# Patient Record
Sex: Male | Born: 1955
Health system: Southern US, Community
[De-identification: ages and names within clinical notes are randomized; demographics above are authoritative.]

## PROBLEM LIST (undated history)

## (undated) DIAGNOSIS — G4730 Sleep apnea, unspecified: Secondary | ICD-10-CM

## (undated) DIAGNOSIS — C801 Malignant (primary) neoplasm, unspecified: Secondary | ICD-10-CM

## (undated) DIAGNOSIS — M1990 Unspecified osteoarthritis, unspecified site: Secondary | ICD-10-CM

## (undated) DIAGNOSIS — Z91030 Bee allergy status: Secondary | ICD-10-CM

## (undated) DIAGNOSIS — I10 Essential (primary) hypertension: Secondary | ICD-10-CM

## (undated) DIAGNOSIS — L439 Lichen planus, unspecified: Secondary | ICD-10-CM

## (undated) DIAGNOSIS — E785 Hyperlipidemia, unspecified: Secondary | ICD-10-CM

## (undated) DIAGNOSIS — E669 Obesity, unspecified: Secondary | ICD-10-CM

## (undated) DIAGNOSIS — J45909 Unspecified asthma, uncomplicated: Secondary | ICD-10-CM

## (undated) DIAGNOSIS — R0789 Other chest pain: Secondary | ICD-10-CM

## (undated) DIAGNOSIS — N529 Male erectile dysfunction, unspecified: Secondary | ICD-10-CM

## (undated) DIAGNOSIS — Z87442 Personal history of urinary calculi: Secondary | ICD-10-CM

## (undated) DIAGNOSIS — K219 Gastro-esophageal reflux disease without esophagitis: Secondary | ICD-10-CM

## (undated) DIAGNOSIS — G4733 Obstructive sleep apnea (adult) (pediatric): Secondary | ICD-10-CM

## (undated) DIAGNOSIS — I447 Left bundle-branch block, unspecified: Secondary | ICD-10-CM

## (undated) HISTORY — DX: Hyperlipidemia, unspecified: E78.5

## (undated) HISTORY — DX: Left bundle-branch block, unspecified: I44.7

## (undated) HISTORY — DX: Other chest pain: R07.89

## (undated) HISTORY — DX: Obstructive sleep apnea (adult) (pediatric): G47.33

## (undated) HISTORY — PX: KNEE ARTHROSCOPY: SUR90

## (undated) HISTORY — DX: Essential (primary) hypertension: I10

## (undated) HISTORY — PX: VASECTOMY: SHX75

## (undated) HISTORY — DX: Sleep apnea, unspecified: G47.30

## (undated) HISTORY — DX: Obesity, unspecified: E66.9

## (undated) HISTORY — DX: Male erectile dysfunction, unspecified: N52.9

## (undated) HISTORY — PX: ANAL FISSURE REPAIR: SHX2312

## (undated) HISTORY — PX: SINUS SURGERY WITH INSTATRAK: SHX5215

## (undated) HISTORY — PX: COLONOSCOPY: SHX174

---

## 2000-02-19 ENCOUNTER — Encounter: Payer: Self-pay | Admitting: Emergency Medicine

## 2000-02-19 ENCOUNTER — Emergency Department (HOSPITAL_COMMUNITY): Admission: EM | Admit: 2000-02-19 | Discharge: 2000-02-19 | Payer: Self-pay | Admitting: Emergency Medicine

## 2000-02-24 ENCOUNTER — Emergency Department (HOSPITAL_COMMUNITY): Admission: EM | Admit: 2000-02-24 | Discharge: 2000-02-24 | Payer: Self-pay | Admitting: Emergency Medicine

## 2000-04-08 ENCOUNTER — Ambulatory Visit (HOSPITAL_BASED_OUTPATIENT_CLINIC_OR_DEPARTMENT_OTHER): Admission: RE | Admit: 2000-04-08 | Discharge: 2000-04-08 | Payer: Self-pay | Admitting: Urology

## 2000-04-08 ENCOUNTER — Encounter (INDEPENDENT_AMBULATORY_CARE_PROVIDER_SITE_OTHER): Payer: Self-pay | Admitting: *Deleted

## 2001-07-17 ENCOUNTER — Encounter: Payer: Self-pay | Admitting: Emergency Medicine

## 2001-07-17 ENCOUNTER — Emergency Department (HOSPITAL_COMMUNITY): Admission: EM | Admit: 2001-07-17 | Discharge: 2001-07-18 | Payer: Self-pay | Admitting: Emergency Medicine

## 2001-07-20 ENCOUNTER — Ambulatory Visit (HOSPITAL_COMMUNITY): Admission: RE | Admit: 2001-07-20 | Discharge: 2001-07-20 | Payer: Self-pay | Admitting: General Surgery

## 2002-10-09 ENCOUNTER — Encounter: Payer: Self-pay | Admitting: Family Medicine

## 2002-10-09 ENCOUNTER — Encounter: Admission: RE | Admit: 2002-10-09 | Discharge: 2002-10-09 | Payer: Self-pay | Admitting: Family Medicine

## 2003-07-12 ENCOUNTER — Encounter: Payer: Self-pay | Admitting: Family Medicine

## 2003-07-12 ENCOUNTER — Encounter: Admission: RE | Admit: 2003-07-12 | Discharge: 2003-07-12 | Payer: Self-pay | Admitting: Family Medicine

## 2003-09-16 ENCOUNTER — Encounter: Payer: Self-pay | Admitting: Pulmonary Disease

## 2003-09-17 ENCOUNTER — Encounter: Payer: Self-pay | Admitting: Pulmonary Disease

## 2005-02-18 ENCOUNTER — Observation Stay (HOSPITAL_COMMUNITY): Admission: EM | Admit: 2005-02-18 | Discharge: 2005-02-19 | Payer: Self-pay | Admitting: Emergency Medicine

## 2005-11-05 ENCOUNTER — Inpatient Hospital Stay (HOSPITAL_COMMUNITY): Admission: RE | Admit: 2005-11-05 | Discharge: 2005-11-06 | Payer: Self-pay | Admitting: Otolaryngology

## 2007-03-25 ENCOUNTER — Emergency Department (HOSPITAL_COMMUNITY): Admission: EM | Admit: 2007-03-25 | Discharge: 2007-03-25 | Payer: Self-pay | Admitting: Emergency Medicine

## 2007-05-25 ENCOUNTER — Ambulatory Visit: Payer: Self-pay | Admitting: Gastroenterology

## 2007-06-23 ENCOUNTER — Ambulatory Visit: Payer: Self-pay | Admitting: Gastroenterology

## 2007-06-23 ENCOUNTER — Encounter: Payer: Self-pay | Admitting: Gastroenterology

## 2007-09-19 HISTORY — PX: US ECHOCARDIOGRAPHY: HXRAD669

## 2008-05-31 ENCOUNTER — Emergency Department (HOSPITAL_COMMUNITY): Admission: EM | Admit: 2008-05-31 | Discharge: 2008-05-31 | Payer: Self-pay | Admitting: Emergency Medicine

## 2008-10-15 DIAGNOSIS — I10 Essential (primary) hypertension: Secondary | ICD-10-CM | POA: Insufficient documentation

## 2008-10-15 DIAGNOSIS — E785 Hyperlipidemia, unspecified: Secondary | ICD-10-CM | POA: Insufficient documentation

## 2008-10-16 ENCOUNTER — Ambulatory Visit: Payer: Self-pay | Admitting: Pulmonary Disease

## 2008-10-16 DIAGNOSIS — E119 Type 2 diabetes mellitus without complications: Secondary | ICD-10-CM | POA: Insufficient documentation

## 2008-10-16 DIAGNOSIS — G4733 Obstructive sleep apnea (adult) (pediatric): Secondary | ICD-10-CM | POA: Insufficient documentation

## 2008-10-16 DIAGNOSIS — J309 Allergic rhinitis, unspecified: Secondary | ICD-10-CM | POA: Insufficient documentation

## 2008-12-11 ENCOUNTER — Encounter: Payer: Self-pay | Admitting: Pulmonary Disease

## 2009-01-15 ENCOUNTER — Encounter: Payer: Self-pay | Admitting: Pulmonary Disease

## 2010-01-16 HISTORY — PX: CARDIOVASCULAR STRESS TEST: SHX262

## 2010-07-23 ENCOUNTER — Ambulatory Visit: Payer: Self-pay | Admitting: Cardiology

## 2010-12-29 NOTE — Miscellaneous (Signed)
Summary: Order for CPAP Equipment/[facility]  Order for CPAP Equipment/[facility]   Imported By: Esmeralda Links D'jimraou 01/17/2009 16:37:18  _____________________________________________________________________  External Attachment:    Type:   Image     Comment:   External Document

## 2010-12-29 NOTE — Assessment & Plan Note (Signed)
Summary: consult for osa   Referred by:  Mosetta Putt PCP:  Mosetta Putt   Chief Complaint:  Sleep Consult.  History of Present Illness: the patient is a 55 year old male who I've been asked to see for management of obstructive sleep apnea. He was initially diagnosed with severe sleep apnea 5-6 years ago, with an AHI of 45 events per hour. He was subsequently placed on CPAP, and had significant improvement in his symptoms.the patient continues to use CPAP compliantly, and is also using a fullface mask that is only one month old. He typically goes to bed at 10 PM, and awakens at 7 AM to start his day. He feels that he sleeps well, and is fairly rested in the mornings upon arising. About 3-4 months ago he began to have issues with afternoon sleepiness, but feels that it is better. He denies any sleepiness with driving. However, he admits that his weight is up 50 pounds since his last CPAP adjustment. His Epworth score today is only 7.     Current Allergies: ! PENICILLIN  Past Medical History:         DM (ICD-250.00)    ALLERGIC RHINITIS (ICD-477.9)    OBSTRUCTIVE SLEEP APNEA (ICD-327.23)    HYPERTENSION (ICD-401.9)    HYPERLIPIDEMIA (ICD-272.4)       Past Surgical History:    NSR   Family History:    allergies: daughters, mother    heart disease: father   Social History:    Patient never smoked.     pt is married.    pt has children.   Risk Factors:  Tobacco use:  never   Review of Systems      See HPI   Vital Signs:  Patient Profile:   55 Years Old Male Weight:      328 pounds O2 Sat:      95 % O2 treatment:    Room Air Temp:     98.5 degrees F oral Pulse rate:   90 / minute BP sitting:   136 / 72  (left arm) Cuff size:   large  Vitals Entered By: Cyndia Diver LPN (October 16, 2008 9:26 AM)             Comments Medications reviewed with patient Cyndia Diver LPN  October 16, 2008 9:26 AM      Physical Exam  General:     obese male in  nad Eyes:     PERRLA and EOMI.   Nose:     patent Mouth:     moderate elongation of soft palate and uvula Neck:     no jvd, tmg, ln Lungs:     clear to auscultation Heart:     rrr, no mrg Abdomen:     soft and nontender, bs present Extremities:     no significant edema Neurologic:     alert and oriented, moves all 4 extremities.      Impression & Recommendations:  Problem # 1:  OBSTRUCTIVE SLEEP APNEA (ICD-327.23) the patient has a history of severe obstructive sleep apnea which has been doing fairly well with CPAP. Based on his history, I would wonder if he is having breakthrough symptoms, and especially with his weight gain of 50 pounds. At this point, I think it would be worthwhile to re-optimize  his pressure.  He will also need a new machine very soon.  I have encouraged him to work aggressively on weight loss.  Medications Added to Medication List This Visit: 1)  Gemfibrozil 600 Mg Tabs (Gemfibrozil) .... Take 1 tablet by mouth two times a day 2)  Chlorthalidone 25 Mg Tabs (Chlorthalidone) .... Take 1 tablet by mouth once a day 3)  Lisinopril 20 Mg Tabs (Lisinopril) .... Take 1 tablet by mouth once a day 4)  Coq10 100 Mg Caps (Coenzyme q10) .... Take 1 tablet by mouth once a day 5)  Multivitamins Tabs (Multiple vitamin) .... Take 1 tablet by mouth once a day 6)  Cephalexin 500 Mg Caps (Cephalexin) .... Take 1 tablet by mouth two times a day 7)  Metformin Hcl 500 Mg Xr24h-tab (Metformin hcl) .... Take 1 tablet by mouth two times a day 8)  Klor-con 10 10 Meq Cr-tabs (Potassium chloride) .... Take 1 tablet by mouth two times a day 9)  Prilosec 20 Mg Cpdr (Omeprazole) .... Take 1 tab by mouth every other day 10)  Vitamin B-12 1000 Mcg Tabs (Cyanocobalamin) .... Take 1 tablet by mouth once a day   Patient Instructions: 1)  will get auto for 2 weeks with download 2)  work on weight loss 3)  call us the first of year when new insurance kicks in, and we will arrange for new  cpap machine. 4)  f/u with me in one year or sooner if problems.   ]

## 2011-04-16 NOTE — Op Note (Signed)
[place]  Patient:    [name] [name], [name] [name]                     MRN: [id number] Proc. Date: 04/08/00 Adm. Date:  [id number] Disc. Date: [id number] Attending:  Ellwood Handler CC:         Verl Dicker, M.D.                           Operative Report  DATE OF BIRTH:  September 14, 1956.  PREOPERATIVE DIAGNOSIS:  Undesired fertility.  POSTOPERATIVE DIAGNOSIS:  Undesired fertility.  OPERATION PERFORMED:  Outpatient vasectomy.  SURGEON:  Verl Dicker, M.D.  ANESTHESIA:  General.  DRAINS:  None.  COMPLICATIONS:  None.  SPECIMENS:  Segment of the right and left vas.  DESCRIPTION OF PROCEDURE:  The patient was prepped and draped in the supine position after initiation of satisfactory level of general anesthesia. ____________ was brought to the skin.  Transverse incision was made through the skin and dartos over the vas.  Dissected free with needle tip forceps, grasped with a ring forcep, divided from its sheath.  It was then incised. About a 1 cm segment of the vas was then removed.  Distal vas was cauterized, placing the needle tip cautery down the lumen of the vas.  The proximal end of the vas was sewn back on itself.  The sheath of the vas was then closed over the cauterized distal end of the vas.  Identical technique was used on both the right and left sides.  Dartos was closed with a figure-of-eight stitch. Skin was closed with a figure-of-eight stitch.  The wound was covered with dry gauze, ice bag and athletic supporter and the patient was returned to recovery in satisfactory condition. DD:  04/08/00 TD:  04/11/00 Job: [id number]

## 2011-04-16 NOTE — Op Note (Signed)
[name] [name]              ACCOUNT NO.:  [id number]   MEDICAL RECORD NO.:  [id number]          PATIENT TYPE:  AMB   LOCATION:  DFTL                         FACILITY:  [facility]   PHYSICIAN:  Christopher E. Ezzard Standing, M.D.DATE OF BIRTH:  12/22/1955   DATE OF PROCEDURE:  11/05/2005  DATE OF DISCHARGE:                                 OPERATIVE REPORT   PREOPERATIVE DIAGNOSES:  1.  Septal deformity with nasal obstruction.  2.  Turbinate hypertrophy with nasal obstruction.   POSTOPERATIVE DIAGNOSES:  1.  Septal deformity with nasal obstruction.  2.  Turbinate hypertrophy with nasal obstruction.   OPERATION:  Septoplasty with bilateral inferior turbinate reductions.   SURGEON:  Kristine Garbe. Ezzard Standing, M.D.   ANESTHESIA:  General endotracheal anesthesia.   COMPLICATIONS:  None.   BRIEF CLINICAL NOTE:  [name] [name] is a 55 year old gentleman with severe  obstructive sleep apnea.  He wears nasal CPAP at home but has chronic nasal  obstruction, right side worse than left, and does not tolerate the CPAP  well.  He is taken to the operating room at this time for septoplasty and  turbinate reductions.  Because of obstructive sleep apnea, will require  overnight hospitalization.   DESCRIPTION OF PROCEDURE:  After adequate endotracheal anesthesia, nose was  prepped with Afrin and cotton pledgets and injected with Xylocaine with  epinephrine.  Nose was then prepped with Betadine solution and draped with  sterile towels.  On examination, patient had a large deviation of the bony  septum to the right superiorly and had some protrusion of the maxillary bony  septum and to the left airway more inferiorly.  Patient also had large  turbinates.  A hemitransfixion incision was made along the caudal edge of  the septum on the left side.  Mucoperichondrial and mucoperiosteal flaps  were elevated posteriorly.  Where the caudal edges and bony septum met  inferiorly, it bowed into the left airway.   More superiorly, the bony septum  protruded to the right side.  The portion that bowed into the left airway  was removed after elevating mucoperichondrial and mucoperiosteal flaps on  either side.  Then the __________ incision was made along the caudal edge of  the septum just anterior to the vomer as a bow to the right and  mucoperiosteal and mucoperichondrial flaps were elevated posteriorly on  either side of the bony septum that bowed to the right and this was removed.  This completed the septoplasty portion of the procedure.   Next, the inferior turbinate reduction surgery was performed on the right  side. Incision was made and submucosal tunnel was made adjacent to the bone.  The bone in the lateral turbinate mucosa on the right side was amputated  using scissors.  Remaining turbinate was outfractured.  On the left side,  just a small inferior portion of the turbinate that was hypertrophied was  excised with turbinate scissors and the remaining turbinate was  outfractured.  Then, using bipolar submucosal cauterization, bipolar  submucosal cauterization was performed in the turbinates bilaterally.  The  remaining turbinate tissue was outfractured.  This completed the  turbinate  reductions.  The septum was based.  The hemitransfixion incision was closed  with interrupted 4-0 chromic sutures x2.  Nose was packed with telfa soaked  in bacitracin ointment.  This completed the procedure.  In addition, 40 mg  of Depo-Medrol was injected into the inferior turbinates bilaterally.  This  completed the procedure.  Nose was packed with Telfa soaked in bacitracin  ointment.  Patient was awoke from anesthesia and transferred to recovery  postoperatively doing well.   DISPOSITION:  [name] will be discharged home in the morning after removing his  nasal packs.   Also of note, patient's preoperative chest x-ray had an apparent mass in the  right cardiophrenic angle and recommend repeat exam with a full  inspiration  and will plan on doing this either prior to discharge or on follow-up.           ______________________________  Kristine Garbe. Ezzard Standing, M.D.     CEN/MEDQ  D:  11/05/2005  T:  11/05/2005  Job:  [id number]

## 2011-04-16 NOTE — H&P (Signed)
[name] [name], [name] [name] NO.:  [id number]   MEDICAL RECORD NO.:  [id number]          PATIENT TYPE:  EMS   LOCATION:  MAJO                         FACILITY:  [facility]   PHYSICIAN:  Renato Battles, M.D.     DATE OF BIRTH:  Jul 26, 1956   DATE OF ADMISSION:  02/18/2005  DATE OF DISCHARGE:                                HISTORY & PHYSICAL   PRIMARY CARE PHYSICIAN:  Mosetta Putt, M.D.   CARDIOLOGIST:  Colleen Can. Deborah Chalk, M.D.   REASON FOR ADMISSION:  Chest pain.   HISTORY OF PRESENT ILLNESS:  The patient is a very pleasant 55 year old  white male who experienced left anterior chest discomfort while at rest  today with no radiation, started around the evening time while the patient  was attending seminar.  Associated with shortness of breath, no nausea, no  vomiting, no palpitations, no sweating.  The pain lasted about 2-1/2 to 3  hours and resolved after the patient received aspirin from EMS.  He does not  report having any similar episodes before.   REVIEW OF SYSTEMS:  CONSTITUTIONAL:  No fever, chills, or night sweats.  No  weight changes.  GASTROINTESTINAL:  No nausea or vomiting.  No diarrhea or  constipation.  GENITOURINARY:  No dysuria, hematuria, or retention.  CARDIOPULMONARY:  Positive for chest pain and shortness of breath.  No  orthopnea, no PND, no cough.   PAST MEDICAL HISTORY:  1.  Hypertension.  2.  Hypercholesterolemia.  3.  Severe obstructive sleep apnea on CPAP, 14 cmH2O, and nasal mask.  4.  Depression.  5.  Questionable history of asthma, the patient had childhood asthma, and      recently had couple episodes of shortness of breath and his primary care      physician suspects is asthma trait.  6.  Left bundle branch block.   PAST SURGICAL HISTORY:  1.  Vasectomy.  2.  Anal fissure repair.   SOCIAL HISTORY:  The patient is a Education officer, environmental.  He denies smoking, alcohol, and  drugs.  He lives with his wife.   FAMILY HISTORY:  Positive for CAD in his  father as well as renal failure in  his father, end-stage.   ALLERGIES:  PENICILLIN.   HOME MEDICATIONS:  1.  Zetia 10 mg p.o. daily.  2.  Lisinopril unknown dose.  3.  Aspirin 81 mg p.o. daily.  4.  Zoloft 25 mg p.o. daily.  5.  Flonase.  6.  Serevent.  7.  Chlorthalidone unknown dose.   PHYSICAL EXAMINATION:  GENERAL:  The patient is alert and oriented x3 in no  acute distress.  VITAL SIGNS:  Temperature 98.3, heart rate 91, respiratory rate 20, blood  pressure 135/80.  HEENT:  Head is normocephalic and atraumatic.  Pupils equal, round, and  reactive to light and accommodation.  Extraocular movements intact  bilaterally.  NECK:  No lymphadenopathy, no thyromegaly, no JVD.  No carotid bruits.  CHEST:  Clear to auscultation bilaterally.  No wheezing, rales, or rhonchi.  HEART:  Regular rate and rhythm, no murmurs.  ABDOMEN:  Obese, soft, nontender, nondistended.  Decreased bowel sounds.  EXTREMITIES:  No cyanosis, clubbing, or edema.   LABORATORY DATA:  CBC was completely normal.  ISTAT; electrolytes were all  within normal limits.  Glucose was mildly elevated at 109.   EKG showed left bundle branch block with no acute changes.   ASSESSMENT:  1.  Chest pain.  Although the pain is atypical for cardiac pain, the patient      has multiple risk factors including high blood pressure, high      cholesterol, overweight.  I am going to admit the patient to telemetry,      check cardiac enzymes q.8h x3, start him on beta blocker, continue      Lisinopril and aspirin.  Provide oxygen, morphine, nitroglycerin as      needed.  2.  Hypertension.  Continue with Chlorthalidone and Lisinopril.  We will      contact Dr. Duaine Dredge in the morning regarding accurate dosing.  3.  Hypercholesterolemia.  Check fasting lipids, continue with Zetia.  4.  Sleep apnea, continue with CPAP.      SA/MEDQ  D:  02/19/2005  T:  02/19/2005  Job:  [id number]   cc:   Mosetta Putt, M.D.  [address]  [place]  [place] [address]  Fax: [phone]   Colleen Can. Deborah Chalk, M.D.  Fax: [phone]

## 2011-04-16 NOTE — Op Note (Signed)
[facility]  Patient:    [name] [name], [name] [name] Visit Number: [id number] MRN: [id number]          Service Type: DSU Location: DAY Attending Physician:  Carson Myrtle Proc. Date: 07/20/01 Adm. Date:  07/20/2001                             Operative Report  PREOPERATIVE DIAGNOSES:  Anal fissure, change in bowel habits.  POSTOPERATIVE DIAGNOSES:  Anal fissure, change in bowel habits.  PROCEDURE:  Flexible sigmoidoscopy and repair of anal fissure.  SURGEON:  Timothy E. Earlene Plater, M.D.  ANESTHESIA:  General.  INDICATIONS:  [name] [name] was referred to me because of intermittent rectal bleeding, change in bowel habits from normal to constipation over the past six months, and straining with an acute anal fissure.  He has been treated in the office with conservative management and has failed, and he has been carefully advised about the surgical procedure and wishes to proceed.  DESCRIPTION OF PROCEDURE:  The patient was prepped at home and brought to the operating room, evaluated by anesthesia, taken to the operating room, placed supine, and general endotracheal anesthesia administered.  He was placed in lithotomy position.  The anus was gently dilated, and then the flexible sigmoidoscope was introduced, advanced to the descending colon, approximately 50-60 cm, and there was some liquid stool, but the prep was okay for a flexible sigmoidoscopy.  Careful evaluation of the mucosa from that distance downward revealed no abnormalities.  The scope was withdrawn and the area prepped and draped in the usual fashion.  Visual exam revealed a deep, angry posterior anal fissure and mild to moderate hemorrhoidal disease.  We had agreed to proceed with repair of the fissure only.  Marcaine 0.5% with epinephrine was injected around and about the anal orifice and massaged in well.  Then, digitally, under direct vision, an internal sphincterotomy accomplished  percutaneously with the 15 blade.  The anus dilated slightly, the operating anoscope placed, anoscopy completed.  No other significant findings except for the hemorrhoidal disease and the posterior fissure.  The posterior fissure was debrided and then thoroughly cauterized.  This completed the procedure.  The external sphincter was intact.  Gelfoam, gauze, and a dry sterile dressing applied.  He tolerated it well, was awakened, and taken to the recovery room in good condition. Attending Physician:  Carson Myrtle DD:  07/20/01 TD:  07/20/01 Job: [id number]

## 2011-04-16 NOTE — Discharge Summary (Signed)
[name], [name]              ACCOUNT NO.:  [id number]   MEDICAL RECORD NO.:  [id number]          PATIENT TYPE:  INP   LOCATION:  3712                         FACILITY:  [facility]   PHYSICIAN:  Gertha Calkin, M.D.     DATE OF BIRTH:   DATE OF ADMISSION:  02/18/2005  DATE OF DISCHARGE:  02/19/2005                                 DISCHARGE SUMMARY   PRIMARY CARE PHYSICIAN:  Mosetta Putt, M.D.   CARDIOLOGIST:  Roger Shelter, M.D.   DISCHARGE DIAGNOSES:  1.  Chest pain, atypical, noncardiac.  2.  Hypertension.  3.  Obstructive sleep apnea.  4.  Depression.  5.  History of left bundle branch block.  6.  Questionable asthma.   DISCHARGE MEDICATIONS:  To continue home medications which include:  1.  Zetia 10 mg once daily.  2.  Lisinopril 20 mg once daily.  3.  Aspirin 81 mg once daily.  4.  Flonase once daily.  5.  Serevent one puff at bedtime.  6.  HCTZ 25 mg p.o. once daily.  7.  Zoloft 25 mg p.o. once daily.  8.  New medication:  Protonix 40 mg p.o. once daily.   HOSPITAL COURSE:  Please see H&P for details of admission.   Problem 1. Chest pain, atypical. The patient ruled out with enzymes. No  changes in EKG, has chronic left bundle branch block. The patient had no  chest pain since admission and basically at this point, we will discharge  home with PPI therapy for empiric GI-related chest pain. He is to follow up  with his cardiologist but no urgent followup needed. Enzymes were negative,  no EKG changes, and likely this is noncardiac.   All background medical issues were stable and no changes in the medications,  to resume his home doses as previously prescribed.   DISPOSITION:  Stable at discharge. No telemetry events, no chest pain, and  with stable vitals at discharge.   FOLLOWUP:  Dr. Duaine Dredge and Dr. Deborah Chalk. The patient will make these  appointments to be seen within the next 2 to 3 weeks. No urgent issues or  followup labs needed at the followup  appointment.      JD/MEDQ  D:  02/19/2005  T:  02/20/2005  Job:  [id number]   cc:   Colleen Can. Deborah Chalk, M.D.  Fax: [phone]   Mosetta Putt, M.D.  [address] [place]  [place] [address]  Fax: [phone]

## 2011-07-12 ENCOUNTER — Encounter: Payer: Self-pay | Admitting: Cardiology

## 2011-07-21 ENCOUNTER — Encounter: Payer: Self-pay | Admitting: Cardiology

## 2011-07-21 ENCOUNTER — Ambulatory Visit (INDEPENDENT_AMBULATORY_CARE_PROVIDER_SITE_OTHER): Payer: BC Managed Care – PPO | Admitting: Cardiology

## 2011-07-21 DIAGNOSIS — E785 Hyperlipidemia, unspecified: Secondary | ICD-10-CM

## 2011-07-21 DIAGNOSIS — I447 Left bundle-branch block, unspecified: Secondary | ICD-10-CM

## 2011-07-21 DIAGNOSIS — E669 Obesity, unspecified: Secondary | ICD-10-CM

## 2011-07-21 DIAGNOSIS — I10 Essential (primary) hypertension: Secondary | ICD-10-CM

## 2011-07-21 NOTE — Patient Instructions (Signed)
You need to focus on weight loss and eating appropriate diet for your diabetes and blood pressure.  You need to increase your aerobic activity.  You need to take medications regularly.  I will see you again as needed.

## 2011-07-21 NOTE — Assessment & Plan Note (Signed)
His left bundle-branch block is chronic. He has had evaluation from a cardiac standpoint including stress testing and echocardiogram which were unremarkable. The patient feels that he doesn't need regular cardiac followup and I agree with him. He does need close followup with his primary care for management of his cardiovascular risk factors. I stressed the importance of being compliant with his dietary therapy and medications. I have recommended that he participate in regular aerobic exercise and lose weight. I will plan on seeing him back on a p.r.n. basis

## 2011-07-21 NOTE — Progress Notes (Signed)
[name] [name] Date of Birth: [birth year]   History of Present Illness: [name] [name] it is seen to establish cardiac followup after the recent retirement of Dr. Maylon Cos. He is a 55 year old white male who has a history of hypertension, diabetes mellitus, and hyperlipidemia. He has a chronic left bundle branch block. He really has no history of cardiac disease. He had cardiac evaluation in February 2011 with a nuclear stress test which was normal. Ejection fraction was 54%. He had an echocardiogram in 2008 which showed mild LVH but otherwise normal study. He denies any symptoms of chest pain, shortness of breath, or palpitations. He has gained over 20 pounds since his visit of last year. He admits that he doesn't follow his diet regularly. He doesn't get any significant exercise other than working in his yard. He admits that sometimes he runs out of his medications and may not take it until he gets his next prescription filled. He hasn't taken his medicines for the past 3 days.  Current Outpatient Prescriptions on File Prior to Visit  Medication Sig Dispense Refill  . albuterol (PROVENTIL HFA;VENTOLIN HFA) 108 (90 BASE) MCG/ACT inhaler Inhale 2 puffs into the lungs every 6 (six) hours as needed.        . Coenzyme Q10 10 MG capsule Take 10 mg by mouth daily.        Marland Kitchen EPINEPHrine (EPIPEN JR) 0.15 MG/0.3ML injection Inject 0.15 mg into the muscle as needed.        . fish oil-omega-3 fatty acids 1000 MG capsule Take 1 g by mouth daily.        Marland Kitchen gemfibrozil (LOPID) 600 MG tablet Take 600 mg by mouth daily.        . metFORMIN (GLUCOPHAGE) 500 MG tablet Take 500 mg by mouth 2 (two) times daily with a meal.        . Multiple Vitamin (MULTIVITAMIN) tablet Take 1 tablet by mouth daily.        . Olmesartan-Amlodipine-HCTZ (TRIBENZOR) 40-5-25 MG TABS Take by mouth daily.        Marland Kitchen omeprazole (PRILOSEC) 20 MG capsule Take 20 mg by mouth daily.        . potassium chloride (K-DUR,KLOR-CON) 10 MEQ tablet Take 10  mEq by mouth 2 (two) times daily.        . rosuvastatin (CRESTOR) 10 MG tablet Take 10 mg by mouth daily.        . sertraline (ZOLOFT) 25 MG tablet Take 25 mg by mouth daily.        . vardenafil (LEVITRA) 20 MG tablet Take 20 mg by mouth daily as needed.        . vitamin B-12 (CYANOCOBALAMIN) 1000 MCG tablet Take 1,000 mcg by mouth daily.        Marland Kitchen zinc gluconate 50 MG tablet Take 50 mg by mouth daily.          Allergies  Allergen Reactions  . Penicillins     Past Medical History  Diagnosis Date  . LBBB (left bundle branch block)   . Diabetes mellitus   . ED (erectile dysfunction)   . Hyperlipidemia   . Hypertension   . Obesity   . OSA (obstructive sleep apnea)   . Chest pain, atypical     Past Surgical History  Procedure Date  . Cardiovascular stress test 01/16/2010    EF 54%  . US echocardiography 09/19/2007    EF 55-60%  . Vasectomy     History  Smoking status  . Never Smoker   Smokeless tobacco  . Not on file    History  Alcohol Use No    Family History  Problem Relation Age of Onset  . Hypertension Mother   . Kidney failure Father     Review of Systems: As noted in history of present illness.  All other systems were reviewed and are negative.  Physical Exam: BP 140/88  Pulse 72  Ht 6' (1.829 m)  Wt 323 lb (146.512 kg)  BMI 43.81 kg/m2 He is an obese white male in no acute distress. His HEENT exam is unremarkable. He has no JVD, adenopathy, thyromegaly, or bruits. Lungs are clear. Cardiac exam reveals a regular rate and rhythm without gallop, murmur, or click. Abdomen is obese and nontender. There are no masses or bruits. Extremities are without edema. He has good pedal pulses. Neurologic exam is nonfocal. LABORATORY DATA:   Assessment / Plan:

## 2011-07-21 NOTE — Assessment & Plan Note (Signed)
Blood pressure is actually fairly well controlled considering hasn't taken his medicines for several days. He is noncompliant with his diet and exercise regimen.

## 2011-08-26 LAB — URINALYSIS, ROUTINE W REFLEX MICROSCOPIC
Bilirubin Urine: NEGATIVE
Glucose, UA: NEGATIVE
Hgb urine dipstick: NEGATIVE
Ketones, ur: NEGATIVE
Nitrite: NEGATIVE
Protein, ur: NEGATIVE
Specific Gravity, Urine: 1.024
Urobilinogen, UA: 0.2
pH: 5.5

## 2012-03-29 ENCOUNTER — Encounter: Payer: Self-pay | Admitting: Gastroenterology

## 2012-03-31 ENCOUNTER — Ambulatory Visit (INDEPENDENT_AMBULATORY_CARE_PROVIDER_SITE_OTHER): Payer: BC Managed Care – PPO | Admitting: Pulmonary Disease

## 2012-03-31 ENCOUNTER — Encounter: Payer: Self-pay | Admitting: Pulmonary Disease

## 2012-03-31 VITALS — BP 130/80 | HR 80 | Temp 98.3°F | Ht 72.0 in | Wt 324.0 lb

## 2012-03-31 DIAGNOSIS — G4733 Obstructive sleep apnea (adult) (pediatric): Secondary | ICD-10-CM

## 2012-03-31 NOTE — Progress Notes (Signed)
  Subjective:    Patient ID: [name] [name], male    DOB: November 08, 1956, 56 y.o.   MRN: [id number]  HPI The patient is a 56 year old male who I've been asked to see for management of obstructive sleep apnea.  He was diagnosed in 2004 with severe OSA, and has been on CPAP since that time.  I saw him last in 2009, and his pressure was optimized again to 12 cm of water.  He has been wearing CPAP compliantly since that time, but most recently his wife has started to note breakthrough snoring events and also apneas.  The patient also has noted nonrestorative sleep most recently.  He has a new CPAP machine from 2009, and uses a full face mask that is a little over a year old and does have some leaks.  The patient is starting to notice sleep pressure again during the day with periods of inactivity, but denies any issues with sleepiness while driving.  The patient states that his weight is stable from 2009, and his [facility] Sleepiness Scale were today is only 5.  Sleep Questionnaire: What time do you typically go to bed?( Between what hours) 10 pm How long does it take you to fall asleep? 5 to 10 mins How many times during the night do you wake up? 2 What time do you get out of bed to start your day? 0700 Do you drive or operate heavy machinery in your occupation? No How much has your weight changed (up or down) over the past two years? (In pounds) 15 lb (6.804 kg) Have you ever had a sleep study before? Yes If yes, location of study? [facility] If yes, date of study? 2004 Do you currently use CPAP? Yes If so, what pressure? unsure of pressure Do you wear oxygen at any time? No     Review of Systems  Constitutional: Negative for fever and unexpected weight change.  HENT: Positive for ear pain and dental problem. Negative for nosebleeds, congestion, sore throat, rhinorrhea, sneezing, trouble swallowing, postnasal drip and sinus pressure.   Eyes: Negative for redness and itching.  Respiratory: Positive for  shortness of breath. Negative for cough, chest tightness and wheezing.   Cardiovascular: Negative for palpitations and leg swelling.  Gastrointestinal: Negative for nausea and vomiting.  Genitourinary: Negative for dysuria.  Musculoskeletal: Positive for joint swelling.  Skin: Negative for rash.  Neurological: Negative for headaches.  Hematological: Does not bruise/bleed easily.  Psychiatric/Behavioral: Negative for dysphoric mood. The patient is not nervous/anxious.        Objective:   Physical Exam Constitutional:  Obese male, no acute distress  HENT:  Nares patent without discharge  Oropharynx without exudate, palate and uvula are thick and elongated.   Eyes:  Perrla, eomi, no scleral icterus  Neck:  No JVD, no TMG  Cardiovascular:  Normal rate, regular rhythm, no rubs or gallops.  No murmurs        Intact distal pulses  Pulmonary :  Normal breath sounds, no stridor or respiratory distress   No rales, rhonchi, or wheezing  Abdominal:  Soft, nondistended, bowel sounds present.  No tenderness noted.   Musculoskeletal:  Minimal lower extremity edema noted.  Lymph Nodes:  No cervical lymphadenopathy noted  Skin:  No cyanosis noted  Neurologic:  Alert, appropriate, moves all 4 extremities without obvious deficit.         Assessment & Plan:

## 2012-03-31 NOTE — Assessment & Plan Note (Signed)
The patient has a history of severe obstructive sleep apnea, but had been doing well on CPAP.  He is now having some breakthrough events, and is not as rested upon arising.  He also notes that he is starting to have periods of sleep pressure during the day like he has had in the past.  We'll need to have his machine pressure checked with a manometer, will get him a new mask, and finally will optimize his pressure again on the automatic setting.  I have also encouraged him to work aggressively on weight loss.

## 2012-03-31 NOTE — Patient Instructions (Signed)
Will have your dme check machine, and also get you a new mask Once this is done, will re-optimize your pressure with the automatic mode on your machine for 2 weeks, and will call you with results. Work on weight loss followup with me in one year if doing well, but call if not doing well.

## 2012-09-18 ENCOUNTER — Telehealth: Payer: Self-pay | Admitting: Pulmonary Disease

## 2012-09-18 NOTE — Telephone Encounter (Signed)
Referral was on 03/31/12 for CPAP Auto x 2 wks. Have left message on Lecretia's phone for her to have someone at [facility] fax me a copy of the 2 wk cpap auto download report.  Rhonda J Cobb

## 2012-09-19 NOTE — Telephone Encounter (Signed)
Download given to me shows the pt is not even on auto!!.  Have given to pcc to straighten out.

## 2012-09-19 NOTE — Telephone Encounter (Signed)
Spoke with patient and he stated that he was contacted by [facility] on 04/12/12 and took cpap to [facility]. Pt states that Mardelle Matte checked pt's cpap machine, gave him a new mask and a SD card. Pt stated that he was then contacted to bring card back to the branch on 04/26/12. Mardelle Matte with [facility] faxed Auto CPAP from 04/12/12 to 04/26/12 today. Report placed in Dr. Shelle Iron pink look at folder.  Pt is aware that we will contact him with results. Rhonda J Cobb

## 2012-09-19 NOTE — Telephone Encounter (Signed)
Download faxed by Mardelle Matte from [facility]. Have placed download in pink look at folder. Rhonda J Cobb

## 2012-09-20 ENCOUNTER — Other Ambulatory Visit: Payer: Self-pay | Admitting: Pulmonary Disease

## 2012-09-20 ENCOUNTER — Encounter: Payer: Self-pay | Admitting: Pulmonary Disease

## 2012-09-20 DIAGNOSIS — G4733 Obstructive sleep apnea (adult) (pediatric): Secondary | ICD-10-CM

## 2012-09-20 NOTE — Telephone Encounter (Signed)
An order has already been sent for this.

## 2013-03-09 ENCOUNTER — Encounter: Payer: Self-pay | Admitting: Gastroenterology

## 2013-04-04 ENCOUNTER — Ambulatory Visit: Payer: BC Managed Care – PPO | Admitting: Pulmonary Disease

## 2013-04-25 ENCOUNTER — Ambulatory Visit (INDEPENDENT_AMBULATORY_CARE_PROVIDER_SITE_OTHER): Payer: BC Managed Care – PPO | Admitting: Pulmonary Disease

## 2013-04-25 ENCOUNTER — Telehealth: Payer: Self-pay | Admitting: Pulmonary Disease

## 2013-04-25 ENCOUNTER — Encounter: Payer: Self-pay | Admitting: Pulmonary Disease

## 2013-04-25 VITALS — BP 152/90 | HR 75 | Temp 97.1°F | Ht 73.0 in | Wt 326.4 lb

## 2013-04-25 DIAGNOSIS — G4733 Obstructive sleep apnea (adult) (pediatric): Secondary | ICD-10-CM

## 2013-04-25 NOTE — Assessment & Plan Note (Signed)
The patient is wearing CPAP compliantly, but is having some late afternoon sleepiness.  He is overdue for a new mask, has gained a little bit of weight since last visit, and has a very old machine that probably needs to be replaced.  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  He will followup in one year doing well.

## 2013-04-25 NOTE — Patient Instructions (Addendum)
Will see if you are eligible for a new machine Will need a new cpap mask. Work on weight loss followup with me in one year if doing well.

## 2013-04-25 NOTE — Progress Notes (Signed)
  Subjective:    Patient ID: [name] [name], male    DOB: 04/17/1956, 57 y.o.   MRN: [id number]  HPI The patient comes in today for followup of his obstructive sleep apnea.  He is wearing CPAP compliant, but has an old machine and thinks he is due for a replacement.  He is also overdue for a new CPAP mask.  He sleeps well with the device, and feels fairly rested in the mornings, but does note some late afternoon sleepiness.  His pressure was optimized again last year.   Review of Systems  Constitutional: Negative for fever and unexpected weight change.  HENT: Negative for ear pain, nosebleeds, congestion, sore throat, rhinorrhea, sneezing, trouble swallowing, dental problem, postnasal drip and sinus pressure.   Eyes: Negative for redness and itching.  Respiratory: Negative for cough, chest tightness, shortness of breath and wheezing.   Cardiovascular: Negative for palpitations and leg swelling.  Gastrointestinal: Negative for nausea and vomiting.  Genitourinary: Negative for dysuria.  Musculoskeletal: Negative for joint swelling.  Skin: Negative for rash.  Neurological: Negative for headaches.  Hematological: Does not bruise/bleed easily.  Psychiatric/Behavioral: Negative for dysphoric mood. The patient is not nervous/anxious.        Objective:   Physical Exam Morbidly obese male in no acute distress Nose without purulence or discharge noted No skin breakdown or pressure necrosis from the CPAP mask Neck without lymphadenopathy or thyromegaly Lower extremities with mild edema, no cyanosis Awake, but does appear mildly sleepy, moves all 4 extremities.       Assessment & Plan:

## 2013-05-04 ENCOUNTER — Telehealth: Payer: Self-pay | Admitting: Pulmonary Disease

## 2013-05-04 NOTE — Telephone Encounter (Signed)
Will forward to KC as an FYI 

## 2014-02-26 ENCOUNTER — Other Ambulatory Visit: Payer: Self-pay | Admitting: Orthopedic Surgery

## 2014-02-26 DIAGNOSIS — M25561 Pain in right knee: Secondary | ICD-10-CM

## 2014-03-20 ENCOUNTER — Ambulatory Visit
Admission: RE | Admit: 2014-03-20 | Discharge: 2014-03-20 | Disposition: A | Discharge status: Home / Self Care | Payer: BC Managed Care – PPO | Source: Ambulatory Visit | Attending: Orthopedic Surgery | Admitting: Orthopedic Surgery

## 2014-03-20 DIAGNOSIS — M25561 Pain in right knee: Secondary | ICD-10-CM

## 2014-04-05 ENCOUNTER — Other Ambulatory Visit (HOSPITAL_COMMUNITY): Payer: Self-pay | Admitting: Orthopaedic Surgery

## 2014-04-26 ENCOUNTER — Encounter: Payer: Self-pay | Admitting: Pulmonary Disease

## 2014-04-26 ENCOUNTER — Ambulatory Visit (INDEPENDENT_AMBULATORY_CARE_PROVIDER_SITE_OTHER): Payer: BC Managed Care – PPO | Admitting: Pulmonary Disease

## 2014-04-26 VITALS — BP 138/80 | HR 83 | Temp 98.4°F | Ht 72.0 in | Wt 322.0 lb

## 2014-04-26 DIAGNOSIS — G4733 Obstructive sleep apnea (adult) (pediatric): Secondary | ICD-10-CM

## 2014-04-26 NOTE — Patient Instructions (Signed)
Will send an order to [facility] to get you a new mask and supplies.  Talk with [facility] about frequency of mask CUSHION exchanges.  Will see if you are eligible for a new machine. Work on weight loss followup with me again in one year.

## 2014-04-26 NOTE — Progress Notes (Signed)
   Subjective:    Patient ID: [name] [name], male    DOB: October 30, 1956, 58 y.o.   MRN: [id number]  HPI The patient comes in today for followup of his obstructive sleep apnea. He is wearing CPAP compliantly, and is having no issues with his mask or pressure. He is due for a new mask and supplies. He feels that he is sleeping fairly well with the device, but has his usual awakenings because of musculoskeletal issues. He feels that he is reasonably rested during the day.   Review of Systems  Constitutional: Negative for fever and unexpected weight change.  HENT: Negative for congestion, dental problem, ear pain, nosebleeds, postnasal drip, rhinorrhea, sinus pressure, sneezing, sore throat and trouble swallowing.   Eyes: Negative for redness and itching.  Respiratory: Negative for cough, chest tightness, shortness of breath and wheezing.   Cardiovascular: Negative for palpitations and leg swelling.  Gastrointestinal: Negative for nausea and vomiting.  Genitourinary: Negative for dysuria.  Musculoskeletal: Negative for joint swelling.  Skin: Negative for rash.  Neurological: Negative for headaches.  Hematological: Does not bruise/bleed easily.  Psychiatric/Behavioral: Negative for dysphoric mood. The patient is not nervous/anxious.        Objective:   Physical Exam Obese male in no acute distress Nose without purulence or discharge noted No skin breakdown or pressure necrosis from the CPAP mask Neck without lymphadenopathy or thyromegaly Lower extremities with edema noted, no cyanosis Alert and oriented, moves all 4 extremities.       Assessment & Plan:

## 2014-04-26 NOTE — Assessment & Plan Note (Signed)
The patient feels that he is doing fairly well with CPAP, and is in need of a new mask and supplies. I've asked him to continue working on weight loss, and to let me know if he wishes to check on getting a new machine. I will see him back in one year if he is doing well.

## 2014-05-01 ENCOUNTER — Encounter (HOSPITAL_COMMUNITY): Payer: Self-pay | Admitting: Pharmacy Technician

## 2014-05-07 ENCOUNTER — Encounter (HOSPITAL_COMMUNITY): Payer: Self-pay

## 2014-05-07 ENCOUNTER — Ambulatory Visit (HOSPITAL_COMMUNITY)
Admission: RE | Admit: 2014-05-07 | Discharge: 2014-05-07 | Disposition: A | Discharge status: Home / Self Care | Payer: BC Managed Care – PPO | Source: Ambulatory Visit | Attending: Anesthesiology | Admitting: Anesthesiology

## 2014-05-07 ENCOUNTER — Encounter (HOSPITAL_COMMUNITY)
Admission: RE | Admit: 2014-05-07 | Discharge: 2014-05-07 | Disposition: A | Discharge status: Home / Self Care | Payer: BC Managed Care – PPO | Source: Ambulatory Visit | Attending: Orthopaedic Surgery | Admitting: Orthopaedic Surgery

## 2014-05-07 DIAGNOSIS — Z01818 Encounter for other preprocedural examination: Secondary | ICD-10-CM | POA: Insufficient documentation

## 2014-05-07 HISTORY — DX: Bee allergy status: Z91.030

## 2014-05-07 HISTORY — DX: Gastro-esophageal reflux disease without esophagitis: K21.9

## 2014-05-07 HISTORY — DX: Lichen planus, unspecified: L43.9

## 2014-05-07 HISTORY — DX: Unspecified osteoarthritis, unspecified site: M19.90

## 2014-05-07 LAB — BASIC METABOLIC PANEL
BUN: 21 mg/dL (ref 6–23)
CO2: 28 mEq/L (ref 19–32)
Calcium: 9.5 mg/dL (ref 8.4–10.5)
Chloride: 97 mEq/L (ref 96–112)
Creatinine, Ser: 0.73 mg/dL (ref 0.50–1.35)
GFR calc Af Amer: >90 mL/min (ref >90)
GFR calc non Af Amer: >90 mL/min (ref >90)
Glucose, Bld: 214 mg/dL — ABNORMAL HIGH (ref 70–99)
Potassium: 4.3 mEq/L (ref 3.7–5.3)
Sodium: 137 mEq/L (ref 137–147)

## 2014-05-07 LAB — CBC
HCT: 40.9 % (ref 39.0–52.0)
Hemoglobin: 14.3 g/dL (ref 13.0–17.0)
MCH: 30.6 pg (ref 26.0–34.0)
MCHC: 35 g/dL (ref 30.0–36.0)
MCV: 87.4 fL (ref 78.0–100.0)
Platelets: 234 10*3/uL (ref 150–400)
RBC: 4.68 MIL/uL (ref 4.22–5.81)
RDW: 13 % (ref 11.5–15.5)
WBC: 6.3 10*3/uL (ref 4.0–10.5)

## 2014-05-07 NOTE — Patient Instructions (Signed)
YOUR SURGERY IS SCHEDULED AT [facility]  ON: Friday  6/12  REPORT TO  SHORT STAY CENTER AT:  6:30 AM   PLEASE COME IN THE [facility] ENTRANCE AND FOLLOW SIGNS TO SHORT STAY CENTER.  DO NOT EAT OR DRINK ANYTHING AFTER MIDNIGHT THE NIGHT BEFORE YOUR SURGERY.  YOU MAY BRUSH YOUR TEETH, RINSE OUT YOUR MOUTH--BUT NO WATER, NO FOOD, NO CHEWING GUM, NO MINTS, NO CANDIES, NO CHEWING TOBACCO.  PLEASE TAKE THE FOLLOWING MEDICATIONS THE AM OF YOUR SURGERY WITH A FEW SIPS OF WATER:  OMEPRAZOLE, CRESTOR, SERTRALINE   IF YOU ARE DIABETIC:  DO NOT TAKE ANY DIABETIC MEDICATIONS THE AM OF YOUR SURGERY.  IF YOU TAKE INSULIN IN THE EVENINGS--PLEASE ONLY TAKE 1/2 NORMAL EVENING DOSE THE NIGHT BEFORE YOUR SURGERY.  NO INSULIN THE AM OF YOUR SURGERY.  IF YOU HAVE SLEEP APNEA AND USE CPAP OR BIPAP--PLEASE BRING THE MASK AND THE TUBING.  DO NOT BRING YOUR MACHINE.  DO NOT BRING VALUABLES, MONEY, CREDIT CARDS.  DO NOT WEAR JEWELRY, MAKE-UP, NAIL POLISH AND NO METAL PINS OR CLIPS IN YOUR HAIR. CONTACT LENS, DENTURES / PARTIALS, GLASSES SHOULD NOT BE WORN TO SURGERY AND IN MOST CASES-HEARING AIDS WILL NEED TO BE REMOVED.  BRING YOUR GLASSES CASE, ANY EQUIPMENT NEEDED FOR YOUR CONTACT LENS. FOR PATIENTS ADMITTED TO THE HOSPITAL--CHECK OUT TIME THE DAY OF DISCHARGE IS 11:00 AM.  ALL INPATIENT ROOMS ARE PRIVATE - WITH BATHROOM, TELEPHONE, TELEVISION AND WIFI INTERNET.  IF YOU ARE BEING DISCHARGED THE SAME DAY OF YOUR SURGERY--YOU CAN NOT DRIVE YOURSELF HOME--AND SHOULD NOT GO HOME ALONE BY TAXI OR BUS.  NO DRIVING OR OPERATING MACHINERY, OR MAKING LEGAL DECISIONS FOR 24 HOURS FOLLOWING ANESTHESIA / PAIN MEDICATIONS.  PLEASE MAKE ARRANGEMENTS FOR SOMEONE TO BE WITH YOU AT HOME THE FIRST 24 HOURS AFTER SURGERY. RESPONSIBLE DRIVER'S NAME / PHONE  PT'S WIFE WILL BE WITH HIM                                                    PLEASE BE AWARE THAT YOU MAY NEED ADDITIONAL BLOOD DRAWN DAY OF YOUR  SURGERY  _______________________________________________________________________   [facility] - Preparing for Surgery Before surgery, you can play an important role.  Because skin is not sterile, your skin needs to be as free of germs as possible.  You can reduce the number of germs on your skin by washing with CHG (chlorahexidine gluconate) soap before surgery.  CHG is an antiseptic cleaner which kills germs and bonds with the skin to continue killing germs even after washing. Please DO NOT use if you have an allergy to CHG or antibacterial soaps.  If your skin becomes reddened/irritated stop using the CHG and inform your nurse when you arrive at Short Stay. Do not shave (including legs and underarms) for at least 48 hours prior to the first CHG shower.  You may shave your face/neck. Please follow these instructions carefully:  1.  Shower with CHG Soap the night before surgery and the  morning of Surgery.  2.  If you choose to wash your hair, wash your hair first as usual with your  normal  shampoo.  3.  After you shampoo, rinse your hair and body thoroughly to remove the  shampoo.  4.  Use CHG as you would any other liquid soap.  You can apply chg directly  to the skin and wash                       Gently with a scrungie or clean washcloth.  5.  Apply the CHG Soap to your body ONLY FROM THE NECK DOWN.   Do not use on face/ open                           Wound or open sores. Avoid contact with eyes, ears mouth and genitals (private parts).                       Wash face,  Genitals (private parts) with your normal soap.             6.  Wash thoroughly, paying special attention to the area where your surgery  will be performed.  7.  Thoroughly rinse your body with warm water from the neck down.  8.  DO NOT shower/wash with your normal soap after using and rinsing off  the CHG Soap.                9.  Pat yourself dry with a clean towel.            10.  Wear clean  pajamas.            11.  Place clean sheets on your bed the night of your first shower and do not  sleep with pets. Day of Surgery : Do not apply any lotions/deodorants the morning of surgery.  Please wear clean clothes to the hospital/surgery center.  FAILURE TO FOLLOW THESE INSTRUCTIONS MAY RESULT IN THE CANCELLATION OF YOUR SURGERY PATIENT SIGNATURE_________________________________  NURSE SIGNATURE__________________________________  ________________________________________________________________________

## 2014-05-07 NOTE — Pre-Procedure Instructions (Signed)
EKG AND OV 03/27/14 ON PT'S CHART FROM DR. WBrigitte Pulse. CXR WAS DONE TODAY - PREOP AT [facility].

## 2014-05-10 ENCOUNTER — Encounter (HOSPITAL_COMMUNITY): Payer: Self-pay | Admitting: *Deleted

## 2014-05-10 ENCOUNTER — Encounter (HOSPITAL_COMMUNITY)
Admission: RE | Disposition: A | Discharge status: Home / Self Care | Payer: Self-pay | Source: Ambulatory Visit | Attending: Orthopaedic Surgery

## 2014-05-10 ENCOUNTER — Ambulatory Visit (HOSPITAL_COMMUNITY)
Admission: RE | Admit: 2014-05-10 | Discharge: 2014-05-10 | Disposition: A | Discharge status: Home / Self Care | Payer: BC Managed Care – PPO | Source: Ambulatory Visit | Attending: Orthopaedic Surgery | Admitting: Orthopaedic Surgery

## 2014-05-10 ENCOUNTER — Ambulatory Visit (HOSPITAL_COMMUNITY): Payer: BC Managed Care – PPO | Admitting: Anesthesiology

## 2014-05-10 ENCOUNTER — Encounter (HOSPITAL_COMMUNITY): Payer: BC Managed Care – PPO | Admitting: Anesthesiology

## 2014-05-10 DIAGNOSIS — K219 Gastro-esophageal reflux disease without esophagitis: Secondary | ICD-10-CM | POA: Insufficient documentation

## 2014-05-10 DIAGNOSIS — S83206A Unspecified tear of unspecified meniscus, current injury, right knee, initial encounter: Secondary | ICD-10-CM

## 2014-05-10 DIAGNOSIS — Z7982 Long term (current) use of aspirin: Secondary | ICD-10-CM | POA: Insufficient documentation

## 2014-05-10 DIAGNOSIS — I10 Essential (primary) hypertension: Secondary | ICD-10-CM | POA: Insufficient documentation

## 2014-05-10 DIAGNOSIS — E119 Type 2 diabetes mellitus without complications: Secondary | ICD-10-CM | POA: Insufficient documentation

## 2014-05-10 DIAGNOSIS — G4733 Obstructive sleep apnea (adult) (pediatric): Secondary | ICD-10-CM | POA: Insufficient documentation

## 2014-05-10 DIAGNOSIS — M23329 Other meniscus derangements, posterior horn of medial meniscus, unspecified knee: Secondary | ICD-10-CM | POA: Insufficient documentation

## 2014-05-10 DIAGNOSIS — Z79899 Other long term (current) drug therapy: Secondary | ICD-10-CM | POA: Insufficient documentation

## 2014-05-10 DIAGNOSIS — E785 Hyperlipidemia, unspecified: Secondary | ICD-10-CM | POA: Insufficient documentation

## 2014-05-10 HISTORY — PX: KNEE ARTHROSCOPY: SHX127

## 2014-05-10 LAB — GLUCOSE, CAPILLARY
Glucose-Capillary: 178 mg/dL — ABNORMAL HIGH (ref 70–99)
Glucose-Capillary: 172 mg/dL — ABNORMAL HIGH (ref 70–99)

## 2014-05-10 SURGERY — ARTHROSCOPY, KNEE
Anesthesia: General | Site: Knee | Laterality: Right

## 2014-05-10 MED ORDER — PROMETHAZINE HCL 25 MG/ML IJ SOLN: 6.2500 mg | INTRAMUSCULAR | Status: DC | PRN

## 2014-05-10 MED ORDER — AMLODIPINE BESYLATE 10 MG PO TABS
10.0000 mg | ORAL_TABLET | Freq: Once | ORAL | Status: AC
  Administered 2014-05-10: 10 mg via ORAL
  Filled 2014-05-10: qty 1

## 2014-05-10 MED ORDER — CLINDAMYCIN PHOSPHATE 900 MG/50ML IV SOLN
900.0000 mg | INTRAVENOUS | Status: AC
  Administered 2014-05-10: 900 mg via INTRAVENOUS

## 2014-05-10 MED ORDER — LACTATED RINGERS IV SOLN
INTRAVENOUS | Status: DC
  Administered 2014-05-10: 08:00:00 via INTRAVENOUS
  Administered 2014-05-10: 1000 mL via INTRAVENOUS

## 2014-05-10 MED ORDER — HYDROMORPHONE HCL PF 1 MG/ML IJ SOLN
0.2500 mg | INTRAMUSCULAR | Status: DC | PRN
  Administered 2014-05-10 (×2): 0.5 mg via INTRAVENOUS

## 2014-05-10 MED ORDER — CLINDAMYCIN PHOSPHATE 900 MG/50ML IV SOLN
INTRAVENOUS | Status: AC
  Filled 2014-05-10: qty 50

## 2014-05-10 MED ORDER — PROPOFOL 10 MG/ML IV BOLUS
INTRAVENOUS | Status: DC | PRN
  Administered 2014-05-10: 5 mg via INTRAVENOUS
  Administered 2014-05-10: 50 mg via INTRAVENOUS
  Administered 2014-05-10: 200 mg via INTRAVENOUS

## 2014-05-10 MED ORDER — ASPIRIN EC 325 MG PO TBEC: 325.0000 mg | DELAYED_RELEASE_TABLET | Freq: Two times a day (BID) | ORAL | Status: DC

## 2014-05-10 MED ORDER — BUPIVACAINE HCL (PF) 0.5 % IJ SOLN
INTRAMUSCULAR | Status: AC
  Filled 2014-05-10: qty 30

## 2014-05-10 MED ORDER — OXYCODONE-ACETAMINOPHEN 5-325 MG PO TABS
1.0000 | ORAL_TABLET | ORAL | Status: DC | PRN
  Administered 2014-05-10: 1 via ORAL
  Filled 2014-05-10: qty 1

## 2014-05-10 MED ORDER — MIDAZOLAM HCL 5 MG/5ML IJ SOLN
INTRAMUSCULAR | Status: DC | PRN
  Administered 2014-05-10: 2 mg via INTRAVENOUS

## 2014-05-10 MED ORDER — BUPIVACAINE HCL (PF) 0.5 % IJ SOLN
INTRAMUSCULAR | Status: DC | PRN
  Administered 2014-05-10: 10 mL

## 2014-05-10 MED ORDER — MORPHINE SULFATE 4 MG/ML IJ SOLN
INTRAMUSCULAR | Status: AC
  Filled 2014-05-10: qty 1

## 2014-05-10 MED ORDER — FENTANYL CITRATE 0.05 MG/ML IJ SOLN: 25.0000 ug | INTRAMUSCULAR | Status: DC | PRN

## 2014-05-10 MED ORDER — OXYCODONE-ACETAMINOPHEN 5-325 MG PO TABS: 1.0000 | ORAL_TABLET | ORAL | Status: DC | PRN

## 2014-05-10 MED ORDER — HYDROMORPHONE HCL PF 1 MG/ML IJ SOLN
INTRAMUSCULAR | Status: AC
  Filled 2014-05-10: qty 1

## 2014-05-10 MED ORDER — MORPHINE SULFATE 4 MG/ML IJ SOLN
INTRAMUSCULAR | Status: DC | PRN
  Administered 2014-05-10: 1 mg

## 2014-05-10 MED ORDER — ONDANSETRON HCL 4 MG/2ML IJ SOLN
INTRAMUSCULAR | Status: DC | PRN
  Administered 2014-05-10: 4 mg via INTRAVENOUS

## 2014-05-10 MED ORDER — FENTANYL CITRATE 0.05 MG/ML IJ SOLN
INTRAMUSCULAR | Status: DC | PRN
  Administered 2014-05-10 (×3): 50 ug via INTRAVENOUS
  Administered 2014-05-10: 100 ug via INTRAVENOUS

## 2014-05-10 SURGICAL SUPPLY — 27 items
BANDAGE ELASTIC 6 VELCRO ST LF (GAUZE/BANDAGES/DRESSINGS) ×1 IMPLANT
BLADE CUDA SHAVER 3.5 (BLADE) ×2 IMPLANT
COUNTER NEEDLE 20 DBL MAG RED (NEEDLE) ×2 IMPLANT
CUFF TOURN SGL QUICK 34 (TOURNIQUET CUFF)
CUFF TRNQT CYL 34X4X40X1 (TOURNIQUET CUFF) ×1 IMPLANT
DRAPE U-SHAPE 47X51 STRL (DRAPES) ×2 IMPLANT
DRSG XEROFORM 1X8 (GAUZE/BANDAGES/DRESSINGS) ×1 IMPLANT
DURAPREP 26ML APPLICATOR (WOUND CARE) ×2 IMPLANT
GAUZE XEROFORM 1X8 LF (GAUZE/BANDAGES/DRESSINGS) ×1 IMPLANT
GLOVE BIO SURGEON STRL SZ7.5 (GLOVE) ×2 IMPLANT
GLOVE BIOGEL PI IND STRL 8 (GLOVE) ×1 IMPLANT
GLOVE BIOGEL PI INDICATOR 8 (GLOVE) ×1
GOWN STRL REUS W/TWL XL LVL3 (GOWN DISPOSABLE) ×2 IMPLANT
IV LACTATED RINGER IRRG 3000ML (IV SOLUTION) ×4
IV LR IRRIG 3000ML ARTHROMATIC (IV SOLUTION) ×2 IMPLANT
MANIFOLD NEPTUNE II (INSTRUMENTS) ×2 IMPLANT
PACK ARTHROSCOPY WL (CUSTOM PROCEDURE TRAY) ×2 IMPLANT
PADDING CAST COTTON 6X4 STRL (CAST SUPPLIES) ×2 IMPLANT
SET ARTHROSCOPY TUBING (MISCELLANEOUS) ×2
SET ARTHROSCOPY TUBING LN (MISCELLANEOUS) ×1 IMPLANT
SUT ETHILON 4 0 PS 2 18 (SUTURE) ×2 IMPLANT
SYR 20CC LL (SYRINGE) ×2 IMPLANT
SYR CONTROL 10ML LL (SYRINGE) ×2 IMPLANT
TOWEL OR 17X26 10 PK STRL BLUE (TOWEL DISPOSABLE) ×2 IMPLANT
TUBING CONNECTING 10 (TUBING) IMPLANT
WAND 90 DEG TURBOVAC W/CORD (SURGICAL WAND) IMPLANT
WRAP KNEE MAXI GEL POST OP (GAUZE/BANDAGES/DRESSINGS) ×2 IMPLANT

## 2014-05-10 NOTE — H&P (Signed)
[name] [name] is an 58 y.o. male.   Chief Complaint:   Right knee pain, swelling, locking and catching HPI:   58 yo male wit right knee locking, catching, and pain.  Has tried all forms of conservative treatment including NSAIDs, injections, exercises, rest, ice/heat and time.  A recent MRI confirms a right knee meniscal tear.  He now wishes to proceed with an arthroscopy given his continued symptoms and failed treatment thus far.  Past Medical History  Diagnosis Date  . LBBB (left bundle branch block)   . ED (erectile dysfunction)   . Hyperlipidemia   . Hypertension   . Obesity   . Chest pain, atypical   . OSA (obstructive sleep apnea)     USES CPAP - DOES NOT KNOW SETTING  . Diabetes mellitus     ORAL MEDS  . GERD (gastroesophageal reflux disease)   . Arthritis     KNEES AND NECK; TORN MENISCUS RT KNEE  . Lichen planus     OF MOUTH  . H/O bee sting allergy     Past Surgical History  Procedure Laterality Date  . Cardiovascular stress test  01/16/2010    EF 54%  . US echocardiography  09/19/2007    EF 55-60%  . Vasectomy    . Sinus surgery with instatrak    . Anal fissure repair      Family History  Problem Relation Age of Onset  . Hypertension Mother   . Kidney failure Father   . Heart disease Mother   . Heart disease Father   . Heart disease Sister   . Rheum arthritis Mother    Social History:  reports that he has never smoked. He has never used smokeless tobacco. He reports that he does not drink alcohol or use illicit drugs.  Allergies:  Allergies  Allergen Reactions  . Penicillins Hives and Rash    Medications Prior to Admission  Medication Sig Dispense Refill  . aspirin 81 MG tablet Take 81 mg by mouth every morning.       . fish oil-omega-3 fatty acids 1000 MG capsule Take 1 g by mouth daily.       Marland Kitchen ketotifen (EYE ITCH RELIEF) 0.025 % ophthalmic solution Place 1 drop into both eyes every morning.      . metFORMIN (GLUCOPHAGE) 500 MG tablet Take 500  mg by mouth 2 (two) times daily with a meal.       . Multiple Vitamin (MULTIVITAMIN) tablet Take 1 tablet by mouth daily.        . Olmesartan-Amlodipine-HCTZ (TRIBENZOR) 40-10-25 MG TABS Take by mouth. ONE EVERY AM      . omeprazole (PRILOSEC) 20 MG capsule Take 20 mg by mouth daily.        . potassium chloride SA (K-DUR,KLOR-CON) 20 MEQ tablet Take 20 mEq by mouth daily.      . rosuvastatin (CRESTOR) 10 MG tablet Take 5 mg by mouth every other day.       . sertraline (ZOLOFT) 25 MG tablet Take 25 mg by mouth every morning.       . sildenafil (REVATIO) 20 MG tablet Take 20 mg by mouth as needed.      . Testosterone 10 MG/ACT (2%) GEL Place 1 application onto the skin at bedtime.       . traMADol (ULTRAM) 50 MG tablet Take 100 mg by mouth every 6 (six) hours as needed for moderate pain.       Marland Kitchen triamcinolone cream (  KENALOG) 0.5 % Apply 1 application topically 3 (three) times daily as needed (itching  FROM POISON IVY).       Marland Kitchen vitamin B-12 (CYANOCOBALAMIN) 1000 MCG tablet Take 1,000 mcg by mouth daily.        Marland Kitchen zinc gluconate 50 MG tablet Take 50 mg by mouth daily.        . Canagliflozin (INVOKANA) 100 MG TABS Take 100 mg by mouth every other day.       . Coenzyme Q10 10 MG capsule Take 10 mg by mouth daily.        Marland Kitchen EPINEPHrine (EPIPEN JR) 0.15 MG/0.3ML injection Inject 0.15 mg into the muscle as needed for anaphylaxis.       . fluocinonide gel (LIDEX) 8.46 % Apply 1 application topically 2 (two) times daily as needed (itching).        Results for orders placed during the hospital encounter of 05/10/14 (from the past 48 hour(s))  GLUCOSE, CAPILLARY     Status: Abnormal   Collection Time    05/10/14  6:24 AM      Result Value Ref Range   Glucose-Capillary 178 (*) 70 - 99 mg/dL   Comment 1 Notify RN     No results found.  Review of Systems  Musculoskeletal: Positive for joint pain.  All other systems reviewed and are negative.   Blood pressure 127/73, pulse 87, temperature 98.6 F (37  C), temperature source Oral, resp. rate 18, SpO2 98.00%. Physical Exam  Constitutional: He is oriented to person, place, and time. He appears well-developed and well-nourished.  HENT:  Head: Normocephalic and atraumatic.  Eyes: EOM are normal. Pupils are equal, round, and reactive to light.  Neck: Normal range of motion. Neck supple.  Cardiovascular: Normal rate and regular rhythm.   Respiratory: Effort normal and breath sounds normal.  GI: Soft. Bowel sounds are normal.  Musculoskeletal:       Right knee: He exhibits decreased range of motion and effusion. Tenderness found. Medial joint line tenderness noted.  Neurological: He is alert and oriented to person, place, and time.  Skin: Skin is warm and dry.  Psychiatric: He has a normal mood and affect.     Assessment/Plan Right knee with symptomatic meniscal tear and failed conservative treatment 1)  To the OR today for a right knee arthroscopy with debridement and a partial mensicectomy.  Valaria Kohut Y 05/10/2014, 7:23 AM

## 2014-05-10 NOTE — Transfer of Care (Signed)
Immediate Anesthesia Transfer of Care Note  Patient: [name] [name]  Procedure(s) Performed: Procedure(s): RIGHT KNEE ARTHROSCOPY WITH PARTIAL MEDIAL MENISCECTOMY (Right)  Patient Location: PACU  Anesthesia Type:General  Level of Consciousness: awake, alert , oriented and patient cooperative  Airway & Oxygen Therapy: Patient Spontanous Breathing and Patient connected to face mask oxygen  Post-op Assessment: Report given to PACU RN and Post -op Vital signs reviewed and stable  Post vital signs: stable  Complications: No apparent anesthesia complications

## 2014-05-10 NOTE — Discharge Instructions (Signed)
Increase your activities as comfort allows.  Ice and elevation throughout the day. You may put full weight on your right leg as comfort allows. Do take a full-strength 325 mg aspirin twice daily with meals for the next weeks. You can remove your dressings tomorrow 6/13 and starting getting your incisions wet in the shower. Starting tomorrow, place daily band-aids over your incisions after you shower.

## 2014-05-10 NOTE — Anesthesia Postprocedure Evaluation (Signed)
  Anesthesia Post-op Note  Patient: [name] [name]  Procedure(s) Performed: Procedure(s) (LRB): RIGHT KNEE ARTHROSCOPY WITH PARTIAL MEDIAL MENISCECTOMY (Right)  Patient Location: PACU  Anesthesia Type: General  Level of Consciousness: awake and alert   Airway and Oxygen Therapy: Patient Spontanous Breathing  Post-op Pain: mild  Post-op Assessment: Post-op Vital signs reviewed, Patient's Cardiovascular Status Stable, Respiratory Function Stable, Patent Airway and No signs of Nausea or vomiting  Last Vitals:  Filed Vitals:   05/10/14 1145  BP: 120/69  Pulse: 84  Temp:   Resp: 16    Post-op Vital Signs: stable   Complications: No apparent anesthesia complications

## 2014-05-10 NOTE — Brief Op Note (Signed)
05/10/2014  9:30 AM  PATIENT:  [name] [name]  58 y.o. male  PRE-OPERATIVE DIAGNOSIS:  Right knee medial meniscal tear  POST-OPERATIVE DIAGNOSIS:  Right knee medial meniscal tear  PROCEDURE:  Procedure(s): RIGHT KNEE ARTHROSCOPY WITH PARTIAL MEDIAL MENISCECTOMY (Right)  SURGEON:  Surgeon(s) and Role:    * Mcarthur Rossetti, MD - Primary  PHYSICIAN ASSISTANT: Benita Stabile, PA-C  ANESTHESIA:   local and general  EBL:  Total I/O In: 0  Out: 10 [Blood:10]  BLOOD ADMINISTERED:none  DRAINS: none   LOCAL MEDICATIONS USED:  MARCAINE     SPECIMEN:  No Specimen  DISPOSITION OF SPECIMEN:  N/A  COUNTS:  YES  TOURNIQUET:  * No tourniquets in log *  DICTATION: .Other Dictation: Dictation Number [id number]  PLAN OF CARE: Discharge to home after PACU  PATIENT DISPOSITION:  PACU - hemodynamically stable.   Delay start of Pharmacological VTE agent (>24hrs) due to surgical blood loss or risk of bleeding: no

## 2014-05-10 NOTE — Op Note (Signed)
[name], [name] [name] NO.:  [id number]  MEDICAL RECORD NO.:  [id number]  LOCATION:  [facility]                         FACILITY:  [facility]  PHYSICIAN:  Lind Guest. Ninfa Linden, M.D.DATE OF BIRTH:  1956/06/25  DATE OF PROCEDURE:  05/10/2014 DATE OF DISCHARGE:                              OPERATIVE REPORT   PREOPERATIVE DIAGNOSIS:  Right knee symptomatic medial meniscal tear.  POSTOPERATIVE DIAGNOSIS:  Right knee symptomatic medial meniscal tear.  PROCEDURE:  Right knee arthroscopy with debridement and partial medial meniscectomy.  SURGEON:  Lind Guest. Ninfa Linden, M.D.  ASSISTANT:  Erskine Emery, PA-C  ANESTHESIA: 1. General. 2. Local with a mixture of 0.25% Marcaine and 4 mg of morphine.  COMPLICATIONS:  None.  INDICATIONS:  [name] [name] is a 58 year old gentleman with symptomatic meniscal tear involving his right knee.  He has had recurrent swelling. He has had aspirations and steroid injection.  An MRI did show a complex posterior horn medial meniscal tear.  We have recommended surgery given what is going on with his knee.  The risks and benefits of surgery were explained to him in detail and he does wish to proceed.  DESCRIPTION OF PROCEDURE:  After informed consent was obtained, appropriate right knee was marked.  He was brought to the operating room, placed supine on the operating table.  General anesthesia was then obtained.  No tourniquet was placed around his upper right leg.  His right leg was prepped and draped from his thigh down the ankle with DuraPrep and sterile drapes including sterile stockinette.  With the bed raised and a lateral leg post utilized, the right knee was flexed off the table.  He was identified as the correct patient, correct right knee.  We then made an anterolateral arthroscopy portal and inserted a cannula into the knee and found a large effusion in his knee.  I then placed the camera in the knee, went to the medial  compartment, and found a complex tear of the posterior horn to mid body of the medial meniscus. Surprisingly, his cartilage was intact throughout his knee.  Using up- cutting biters and arthroscopic shaver, I performed a partial medial meniscectomy.  We then probed the ACL and PCL and found those to be intact.  The lateral compartment was intact and the patellofemoral joint just had synovitis, but intact cartilage.  We then allowed fluid lavage to the knee and removed some of the synovitis from the knee.  We then drained all fluid from the knee and removed all instrumentation.  We closed the portal sites with interrupted interrupted nylon suture and then inserted a mixture of Marcaine and morphine into the knee.  Well- padded sterile dressing was applied and he was awakened, extubated, and taken to the recovery room in a stable condition.  All final counts were correct and there were no complications noted.  Postoperatively, we will have him increase his activities as he tolerates and we will see him back in the office in a week.     Lind Guest. Ninfa Linden, M.D.     CYB/MEDQ  D:  05/10/2014  T:  05/10/2014  Job:  [id number]

## 2014-05-10 NOTE — Anesthesia Preprocedure Evaluation (Signed)
Anesthesia Evaluation  Patient identified by MRN, date of birth, ID band Patient awake    Reviewed: Allergy & Precautions, H&P , NPO status , Patient's Chart, lab work & pertinent test results  Airway Mallampati: II TM Distance: >3 FB Neck ROM: Full    Dental no notable dental hx.    Pulmonary sleep apnea ,  breath sounds clear to auscultation  Pulmonary exam normal       Cardiovascular hypertension, Pt. on medications + dysrhythmias Rhythm:Regular Rate:Normal  LBBB   Neuro/Psych negative neurological ROS  negative psych ROS   GI/Hepatic Neg liver ROS, GERD-  Medicated,  Endo/Other  diabetes, Type 2, Oral Hypoglycemic AgentsMorbid obesity  Renal/GU negative Renal ROS  negative genitourinary   Musculoskeletal negative musculoskeletal ROS (+)   Abdominal   Peds negative pediatric ROS (+)  Hematology negative hematology ROS (+)   Anesthesia Other Findings   Reproductive/Obstetrics negative OB ROS                           Anesthesia Physical Anesthesia Plan  ASA: III  Anesthesia Plan: General   Post-op Pain Management:    Induction: Intravenous  Airway Management Planned: LMA  Additional Equipment:   Intra-op Plan:   Post-operative Plan: Extubation in OR  Informed Consent: I have reviewed the patients History and Physical, chart, labs and discussed the procedure including the risks, benefits and alternatives for the proposed anesthesia with the patient or authorized representative who has indicated his/her understanding and acceptance.   Dental advisory given  Plan Discussed with: CRNA  Anesthesia Plan Comments:         Anesthesia Quick Evaluation

## 2014-05-13 ENCOUNTER — Encounter (HOSPITAL_COMMUNITY): Payer: Self-pay | Admitting: Orthopaedic Surgery

## 2014-10-18 ENCOUNTER — Other Ambulatory Visit: Payer: Self-pay | Admitting: Dermatology

## 2014-11-12 ENCOUNTER — Ambulatory Visit (INDEPENDENT_AMBULATORY_CARE_PROVIDER_SITE_OTHER): Payer: BC Managed Care – PPO

## 2014-11-12 ENCOUNTER — Ambulatory Visit (INDEPENDENT_AMBULATORY_CARE_PROVIDER_SITE_OTHER): Payer: BC Managed Care – PPO | Admitting: Podiatry

## 2014-11-12 VITALS — BP 160/92 | HR 82 | Resp 16 | Ht 72.0 in | Wt 315.0 lb

## 2014-11-12 DIAGNOSIS — M779 Enthesopathy, unspecified: Secondary | ICD-10-CM

## 2014-11-12 DIAGNOSIS — M722 Plantar fascial fibromatosis: Secondary | ICD-10-CM

## 2014-11-12 MED ORDER — DICLOFENAC SODIUM 75 MG PO TBEC: 75.0000 mg | DELAYED_RELEASE_TABLET | Freq: Two times a day (BID) | ORAL | Status: DC

## 2014-11-12 MED ORDER — TRIAMCINOLONE ACETONIDE 10 MG/ML IJ SUSP
10.0000 mg | Freq: Once | INTRAMUSCULAR | Status: AC
  Administered 2014-11-12: 10 mg

## 2014-11-12 NOTE — Patient Instructions (Signed)

## 2014-11-12 NOTE — Progress Notes (Signed)
Subjective:     Patient ID: [name] [name], male   DOB: [birth year], 58 y.o.   MRN: [id number]  HPI patient states my right heel has been very sore and it has been hurting me with ambulation and worse as the day goes on. I'm also getting pain on the outside of my foot which is probably from walking differently   Review of Systems  All other systems reviewed and are negative.      Objective:   Physical Exam  Constitutional: He is oriented to person, place, and time.  Cardiovascular: Intact distal pulses.   Musculoskeletal: Normal range of motion.  Neurological: He is oriented to person, place, and time.  Skin: Skin is warm.  Nursing note and vitals reviewed.  neurovascular status intact with muscle strength adequate and range of motion subtalar and midtarsal joint within normal limits. Patient is noted to have good digital perfusion is well oriented 3 with no equinus condition and I noted exquisite discomfort plantar aspect right heel at the insertion of the tendon into the calcaneus     Assessment:     Acute plantar fasciitis right heel insertional point    Plan:     H&P and x-rays reviewed and today I injected the right plantar fascia 3 mg Kenalog 5 mg Xylocaine and gave instructions on physical therapy. Placed in a fascially brace and did not put him on any medicines due to inability to tolerate and reappoint in 1 week for consideration also of orthotics

## 2014-11-12 NOTE — Progress Notes (Signed)
   Subjective:    Patient ID: [name] [name], male    DOB: 27-May-1956, 58 y.o.   MRN: [id number]  HPI Comments: Right heel pain and lateral side foot pain has been going on for months   Foot Pain      Review of Systems  All other systems reviewed and are negative.      Objective:   Physical Exam        Assessment & Plan:

## 2014-11-19 ENCOUNTER — Ambulatory Visit (INDEPENDENT_AMBULATORY_CARE_PROVIDER_SITE_OTHER): Payer: BC Managed Care – PPO | Admitting: Podiatry

## 2014-11-19 VITALS — BP 131/75 | HR 94

## 2014-11-19 DIAGNOSIS — M722 Plantar fascial fibromatosis: Secondary | ICD-10-CM

## 2014-11-20 NOTE — Progress Notes (Signed)
Subjective:     Patient ID: [name] [name], male   DOB: 11-01-1956, 58 y.o.   MRN: [id number]  HPI patient presents stating I'm having pain still at the medial band of the plantar fascia right but quite a bit improved from previous visit with good reduction of discomfort with ambulation   Review of Systems     Objective:   Physical Exam Neurovascular status intact with muscle strength adequate and discomfort in the plantar fascial right which has improved but still present    Assessment:     Plantar fasciitis right with fluid buildup noted around the medial band that has improved but still present with also biomechanical dysfunction of the arch and depression of the arch upon weightbearing    Plan:     Chronic structural changes in the arch creating inflammation and leading to chronic plantar fasciitis. Today I went ahead and I scanned for custom orthotics to reduce the stress against the plantar fascial

## 2015-01-03 ENCOUNTER — Ambulatory Visit (INDEPENDENT_AMBULATORY_CARE_PROVIDER_SITE_OTHER): Payer: BLUE CROSS/BLUE SHIELD | Admitting: *Deleted

## 2015-01-03 DIAGNOSIS — M722 Plantar fascial fibromatosis: Secondary | ICD-10-CM

## 2015-01-03 NOTE — Progress Notes (Signed)
Orthotics picked up. Given instructions. Recheck 1 mo., unless symptoms clear.

## 2015-01-03 NOTE — Patient Instructions (Signed)

## 2015-01-31 ENCOUNTER — Ambulatory Visit: Payer: BLUE CROSS/BLUE SHIELD

## 2015-04-03 ENCOUNTER — Encounter: Payer: Self-pay | Admitting: Gastroenterology

## 2015-04-04 ENCOUNTER — Ambulatory Visit (INDEPENDENT_AMBULATORY_CARE_PROVIDER_SITE_OTHER): Payer: BLUE CROSS/BLUE SHIELD | Admitting: Cardiology

## 2015-04-04 ENCOUNTER — Telehealth: Payer: Self-pay | Admitting: Cardiology

## 2015-04-04 ENCOUNTER — Encounter: Payer: Self-pay | Admitting: Cardiology

## 2015-04-04 VITALS — BP 144/80 | HR 87 | Ht 72.0 in | Wt 311.4 lb

## 2015-04-04 DIAGNOSIS — I10 Essential (primary) hypertension: Secondary | ICD-10-CM

## 2015-04-04 DIAGNOSIS — G4733 Obstructive sleep apnea (adult) (pediatric): Secondary | ICD-10-CM

## 2015-04-04 DIAGNOSIS — I447 Left bundle-branch block, unspecified: Secondary | ICD-10-CM

## 2015-04-04 DIAGNOSIS — E785 Hyperlipidemia, unspecified: Secondary | ICD-10-CM | POA: Diagnosis not present

## 2015-04-04 DIAGNOSIS — R072 Precordial pain: Secondary | ICD-10-CM

## 2015-04-04 DIAGNOSIS — R079 Chest pain, unspecified: Secondary | ICD-10-CM | POA: Insufficient documentation

## 2015-04-04 NOTE — Telephone Encounter (Signed)
Patient saw Dr. Martinique today and was scheduled for stress test. We scheduled it for May 18 and 19.  Wife has lots of questions as to why we are doing it (all her husband could tell her was that his EKG was abnormal).  She is worried and wants some additional information about the test itself and what was wrong with the EKG.Marland Kitchen

## 2015-04-04 NOTE — Telephone Encounter (Signed)
Returned call to patient's wife no answer.LMTC. 

## 2015-04-04 NOTE — Progress Notes (Signed)
[name] [name] Date of Birth: [birth year]   History of Present Illness: [name] [name] it is seen as a work in at t69 year old white male who has a history of hypertension, diabetes mellitus, and hyperlipidemia. He has a chronic left bundle branch block. He has OSA and morbid obesity. He had cardiac evaluation in February 2011 with a nuclear stress test which was normal. Ejection fraction was 54%. He had an echocardiogram in 2008 which showed mild LVH but otherwise normal study. One month ago he experienced symptoms of chest pain in the mid chest anteriorly. He describes a tightness. He thought this was related to lifting a small stump in his yard and the symptoms gradually abated. He states when he had the pain he did not feel well at all and had no energy. The only other time he can recall having chest pain was last summer driving home from a doctor's visit when he suddenly had CP, sweating and nausea. No recent change in medical history.   Current Outpatient Prescriptions on File Prior to Visit  Medication Sig Dispense Refill  . Canagliflozin (INVOKANA) 100 MG TABS Take 100 mg by mouth every other day.     . Coenzyme Q10 10 MG capsule Take 10 mg by mouth daily.      . diclofenac (VOLTAREN) 75 MG EC tablet Take 1 tablet (75 mg total) by mouth 2 (two) times daily. 50 tablet 2  . EPINEPHrine (EPIPEN JR) 0.15 MG/0.3ML injection Inject 0.15 mg into the muscle as needed for anaphylaxis.     . fish oil-omega-3 fatty acids 1000 MG capsule Take 1 g by mouth daily.     . fluocinonide gel (LIDEX) 5.27 % Apply 1 application topically 2 (two) times daily as needed (itching).    Marland Kitchen ketotifen (EYE ITCH RELIEF) 0.025 % ophthalmic solution Place 1 drop into both eyes every morning.    . metFORMIN (GLUCOPHAGE) 500 MG tablet Take 500 mg by mouth 2 (two) times daily with a meal.     . Multiple Vitamin (MULTIVITAMIN) tablet Take 1 tablet by mouth daily.      . Olmesartan-Amlodipine-HCTZ (TRIBENZOR) 40-10-25 MG TABS  Take by mouth. ONE EVERY AM    . omeprazole (PRILOSEC) 20 MG capsule Take 20 mg by mouth daily.      Marland Kitchen oxyCODONE-acetaminophen (ROXICET) 5-325 MG per tablet Take 1-2 tablets by mouth every 4 (four) hours as needed for severe pain. 60 tablet 0  . potassium chloride SA (K-DUR,KLOR-CON) 20 MEQ tablet Take 20 mEq by mouth daily.    . rosuvastatin (CRESTOR) 10 MG tablet Take 5 mg by mouth daily.     . sertraline (ZOLOFT) 25 MG tablet Take 25 mg by mouth every morning.     . sildenafil (REVATIO) 20 MG tablet Take 20 mg by mouth as needed.    . Testosterone 10 MG/ACT (2%) GEL Place 1 application onto the skin at bedtime.     . triamcinolone cream (KENALOG) 0.5 % Apply 1 application topically 3 (three) times daily as needed (itching  FROM POISON IVY).     Marland Kitchen vitamin B-12 (CYANOCOBALAMIN) 1000 MCG tablet Take 1,000 mcg by mouth daily.      Marland Kitchen zinc gluconate 50 MG tablet Take 50 mg by mouth daily.       No current facility-administered medications on file prior to visit.    Allergies  Allergen Reactions  . Penicillins Hives and Rash    Past Medical History  Diagnosis Date  . LBBB (left bundle  branch block)   . ED (erectile dysfunction)   . Hyperlipidemia   . Hypertension   . Obesity   . Chest pain, atypical   . OSA (obstructive sleep apnea)     USES CPAP - DOES NOT KNOW SETTING  . Diabetes mellitus     ORAL MEDS  . GERD (gastroesophageal reflux disease)   . Arthritis     KNEES AND NECK; TORN MENISCUS RT KNEE  . Lichen planus     OF MOUTH  . H/O bee sting allergy     Past Surgical History  Procedure Laterality Date  . Cardiovascular stress test  01/16/2010    EF 54%  . US echocardiography  09/19/2007    EF 55-60%  . Vasectomy    . Sinus surgery with instatrak    . Anal fissure repair    . Knee arthroscopy Right 05/10/2014    Procedure: RIGHT KNEE ARTHROSCOPY WITH PARTIAL MEDIAL MENISCECTOMY;  Surgeon: Mcarthur Rossetti, MD;  Location: [facility];  Service: Orthopedics;   Laterality: Right;    History  Smoking status  . Never Smoker   Smokeless tobacco  . Never Used    History  Alcohol Use No    Family History  Problem Relation Age of Onset  . Hypertension Mother   . Kidney failure Father   . Heart disease Mother   . Heart disease Father   . Heart disease Sister   . Rheum arthritis Mother     Review of Systems: As noted in history of present illness.  All other systems were reviewed and are negative.  Physical Exam: BP 144/80 mmHg  Pulse 87  Ht 6' (1.829 m)  Wt 311 lb 6.4 oz (141.25 kg)  BMI 42.22 kg/m2 He is an obese white male in no acute distress. His HEENT exam is unremarkable. He has no JVD, adenopathy, thyromegaly, or bruits. Lungs are clear. Cardiac exam reveals a regular rate and rhythm without gallop, murmur, or click. Abdomen is obese and nontender. There are no masses or bruits. Extremities are without edema. He has good pedal pulses. Neurologic exam is nonfocal.  LABORATORY DATA: Ecg dated 04/03/15 reviewed and shows NSR with inferolateral T wave inversion consistent with ischemia. Possible septal infarct age undetermined.  Compared with Ecg 03/27/14 LBBB resolved.   Assessment / Plan: 1. Chest pain. Symptoms somewhat atypical but patient has multiple cardiac risk factors and is at least at intermediate risk. Ecg changes are difficult to characterize since before he had a LBBB. I have recommended a 2 day Lexiscan Myoview to assess further. If normal will reassure. If abnormal will need to consider a cardiac cath.  2. DM type 2 3. HTN 4. Dyslipidemia. 5. History of LBBB- not present on recent Ecg 6. Morbid obesity with OSA.

## 2015-04-07 NOTE — Telephone Encounter (Signed)
Returning your call from Friday.Please call her on her cell phone.

## 2015-04-07 NOTE — Telephone Encounter (Signed)
Returned call to patient's wife no answer.LMTC. 

## 2015-04-11 NOTE — Telephone Encounter (Signed)
Returned call to patient's wife.Myoview instructions reviewed with wife.

## 2015-04-15 ENCOUNTER — Telehealth (HOSPITAL_COMMUNITY): Payer: Self-pay | Admitting: *Deleted

## 2015-04-15 NOTE — Telephone Encounter (Signed)
Patient called back andt given detailed instructions per Myocardial Perfusion Study Information Sheet for test on 04/17/15 at 7:30 Patient verbalized understanding. Hubbard Robinson, RN

## 2015-04-15 NOTE — Telephone Encounter (Signed)
Left message on voicemail in reference to upcoming appointment scheduled for 04/17/15. Phone number given for a call back so  instructions can be given. Hubbard Robinson, RN

## 2015-04-16 ENCOUNTER — Ambulatory Visit (HOSPITAL_COMMUNITY): Payer: BLUE CROSS/BLUE SHIELD

## 2015-04-17 ENCOUNTER — Ambulatory Visit (HOSPITAL_COMMUNITY)
Admission: RE | Admit: 2015-04-17 | Discharge: 2015-04-17 | Disposition: A | Discharge status: Home / Self Care | Payer: BLUE CROSS/BLUE SHIELD | Source: Ambulatory Visit | Attending: Cardiovascular Disease | Admitting: Cardiovascular Disease

## 2015-04-17 ENCOUNTER — Ambulatory Visit (HOSPITAL_COMMUNITY): Payer: BLUE CROSS/BLUE SHIELD

## 2015-04-17 DIAGNOSIS — I447 Left bundle-branch block, unspecified: Secondary | ICD-10-CM

## 2015-04-17 DIAGNOSIS — E119 Type 2 diabetes mellitus without complications: Secondary | ICD-10-CM | POA: Diagnosis not present

## 2015-04-17 DIAGNOSIS — I10 Essential (primary) hypertension: Secondary | ICD-10-CM | POA: Diagnosis not present

## 2015-04-17 DIAGNOSIS — R9439 Abnormal result of other cardiovascular function study: Secondary | ICD-10-CM | POA: Diagnosis not present

## 2015-04-17 DIAGNOSIS — G4733 Obstructive sleep apnea (adult) (pediatric): Secondary | ICD-10-CM | POA: Diagnosis not present

## 2015-04-17 DIAGNOSIS — R072 Precordial pain: Secondary | ICD-10-CM | POA: Diagnosis not present

## 2015-04-17 DIAGNOSIS — E785 Hyperlipidemia, unspecified: Secondary | ICD-10-CM | POA: Diagnosis not present

## 2015-04-17 MED ORDER — REGADENOSON 0.4 MG/5ML IV SOLN
0.4000 mg | Freq: Once | INTRAVENOUS | Status: AC
  Administered 2015-04-17: 0.4 mg via INTRAVENOUS

## 2015-04-17 MED ORDER — TECHNETIUM TC 99M SESTAMIBI GENERIC - CARDIOLITE
31.0000 | Freq: Once | INTRAVENOUS | Status: AC | PRN
  Administered 2015-04-17: 31 via INTRAVENOUS

## 2015-04-18 ENCOUNTER — Ambulatory Visit (HOSPITAL_COMMUNITY)
Admission: RE | Admit: 2015-04-18 | Discharge: 2015-04-18 | Disposition: A | Discharge status: Home / Self Care | Payer: BLUE CROSS/BLUE SHIELD | Source: Ambulatory Visit | Attending: Cardiology | Admitting: Cardiology

## 2015-04-18 ENCOUNTER — Other Ambulatory Visit: Payer: Self-pay | Admitting: Cardiology

## 2015-04-18 ENCOUNTER — Inpatient Hospital Stay (HOSPITAL_COMMUNITY): Admission: RE | Admit: 2015-04-18 | Payer: BLUE CROSS/BLUE SHIELD | Source: Ambulatory Visit

## 2015-04-18 DIAGNOSIS — I10 Essential (primary) hypertension: Secondary | ICD-10-CM

## 2015-04-18 LAB — MYOCARDIAL PERFUSION IMAGING
Estimated workload: 1 METS
LV dias vol: 110 mL
LV sys vol: 55 mL
Nuc Stress EF: 50 %
Peak BP: 159 mmHg
Peak HR: 113 {beats}/min
Percent of predicted max HR: 71 %
Rest HR: 83 {beats}/min
SDS: 8
SRS: 5
SSS: 13
Stage 1 DBP: 76 mmHg
Stage 1 Grade: 0 %
Stage 1 HR: 87 {beats}/min
Stage 1 SBP: 145 mmHg
Stage 1 Speed: 0 mph
Stage 2 Grade: 0 %
Stage 2 HR: 87 {beats}/min
Stage 2 Speed: 0 mph
Stage 3 DBP: 78 mmHg
Stage 3 Grade: 0 %
Stage 3 HR: 113 {beats}/min
Stage 3 SBP: 159 mmHg
Stage 3 Speed: 0 mph
Stage 4 Grade: 0 %
Stage 4 HR: 90 {beats}/min
Stage 4 Speed: 0 mph
TID: 0.84

## 2015-04-21 ENCOUNTER — Other Ambulatory Visit: Payer: Self-pay

## 2015-04-21 ENCOUNTER — Telehealth: Payer: Self-pay | Admitting: Cardiology

## 2015-04-21 DIAGNOSIS — R072 Precordial pain: Secondary | ICD-10-CM

## 2015-04-21 DIAGNOSIS — R9439 Abnormal result of other cardiovascular function study: Secondary | ICD-10-CM

## 2015-04-21 NOTE — Telephone Encounter (Signed)
Returned call to patient.Myoview abnormal.Dr.Jordan advised needs a cath this week.Stated he is suppose to close on a house this week.Stated he will call his agent and call me back.

## 2015-04-21 NOTE — Telephone Encounter (Signed)
Received call from patient.He stated he can have cath Fri 04/25/15.Cath scheduled Fri 5/27/1.Patient will pick up instructions and have lab,cxr 04/22/15.

## 2015-04-21 NOTE — Telephone Encounter (Signed)
Returning your call from this morning,

## 2015-04-22 ENCOUNTER — Ambulatory Visit
Admission: RE | Admit: 2015-04-22 | Discharge: 2015-04-22 | Disposition: A | Discharge status: Home / Self Care | Payer: BLUE CROSS/BLUE SHIELD | Source: Ambulatory Visit | Attending: Cardiology | Admitting: Cardiology

## 2015-04-22 DIAGNOSIS — R072 Precordial pain: Secondary | ICD-10-CM

## 2015-04-22 DIAGNOSIS — R9439 Abnormal result of other cardiovascular function study: Secondary | ICD-10-CM

## 2015-04-22 LAB — CBC WITH DIFFERENTIAL/PLATELET
Basophils Absolute: 0.1 10*3/uL (ref 0.0–0.1)
Basophils Relative: 1 % (ref 0–1)
Eosinophils Absolute: 0.2 10*3/uL (ref 0.0–0.7)
Eosinophils Relative: 4 % (ref 0–5)
HCT: 45.7 % (ref 39.0–52.0)
Hemoglobin: 15.5 g/dL (ref 13.0–17.0)
Lymphocytes Relative: 32 % (ref 12–46)
Lymphs Abs: 1.9 10*3/uL (ref 0.7–4.0)
MCH: 30.5 pg (ref 26.0–34.0)
MCHC: 33.9 g/dL (ref 30.0–36.0)
MCV: 89.8 fL (ref 78.0–100.0)
MPV: 10.2 fL (ref 8.6–12.4)
Monocytes Absolute: 0.3 10*3/uL (ref 0.1–1.0)
Monocytes Relative: 6 % (ref 3–12)
Neutro Abs: 3.3 10*3/uL (ref 1.7–7.7)
Neutrophils Relative %: 57 % (ref 43–77)
Platelets: 252 10*3/uL (ref 150–400)
RBC: 5.09 MIL/uL (ref 4.22–5.81)
RDW: 13.5 % (ref 11.5–15.5)
WBC: 5.8 10*3/uL (ref 4.0–10.5)

## 2015-04-22 LAB — BASIC METABOLIC PANEL
BUN: 18 mg/dL (ref 6–23)
CO2: 25 mEq/L (ref 19–32)
Calcium: 9.4 mg/dL (ref 8.4–10.5)
Chloride: 99 mEq/L (ref 96–112)
Creat: 0.75 mg/dL (ref 0.50–1.35)
Glucose, Bld: 160 mg/dL — ABNORMAL HIGH (ref 70–99)
Potassium: 4.5 mEq/L (ref 3.5–5.3)
Sodium: 138 mEq/L (ref 135–145)

## 2015-04-23 ENCOUNTER — Telehealth: Payer: Self-pay | Admitting: *Deleted

## 2015-04-23 LAB — PROTIME-INR
INR: 0.99 (ref <1.50)
Prothrombin Time: 13.1 seconds (ref 11.6–15.2)

## 2015-04-23 NOTE — Telephone Encounter (Signed)
-----   Message from Peter M Martinique, MD sent at 04/22/2015 11:03 AM EDT ----- CXR is OK.  Peter Martinique MD, [facility]

## 2015-04-23 NOTE — Telephone Encounter (Signed)
-----   Message from Peter M Martinique, MD sent at 04/23/2015  8:54 AM EDT ----- Labs OK for cath  Peter Martinique MD, [facility]

## 2015-04-23 NOTE — Telephone Encounter (Signed)
LEFT ON SECURE VOICEMAIL LABS AND XRAY OK FOR CATH ANY QUESTION CALL BACK

## 2015-04-24 ENCOUNTER — Telehealth: Payer: Self-pay | Admitting: Cardiology

## 2015-04-24 ENCOUNTER — Other Ambulatory Visit: Payer: Self-pay | Admitting: Cardiology

## 2015-04-24 NOTE — Telephone Encounter (Signed)
Per the Answering Service: Needs to verify if he still need his Cath on 04-25-15?

## 2015-04-24 NOTE — Telephone Encounter (Signed)
Received call from patient.Advised he is scheduled for a cardiac cath 04/25/15 arrive at [facility] 8:30 am.

## 2015-04-25 ENCOUNTER — Encounter (HOSPITAL_COMMUNITY)
Admission: RE | Disposition: A | Discharge status: Home / Self Care | Payer: BLUE CROSS/BLUE SHIELD | Source: Ambulatory Visit | Attending: Cardiology

## 2015-04-25 ENCOUNTER — Ambulatory Visit (HOSPITAL_COMMUNITY)
Admission: RE | Admit: 2015-04-25 | Discharge: 2015-04-25 | Disposition: A | Discharge status: Home / Self Care | Payer: BLUE CROSS/BLUE SHIELD | Source: Ambulatory Visit | Attending: Cardiology | Admitting: Cardiology

## 2015-04-25 ENCOUNTER — Encounter (HOSPITAL_COMMUNITY): Payer: Self-pay | Admitting: Cardiology

## 2015-04-25 DIAGNOSIS — R0789 Other chest pain: Secondary | ICD-10-CM | POA: Diagnosis not present

## 2015-04-25 DIAGNOSIS — R9439 Abnormal result of other cardiovascular function study: Secondary | ICD-10-CM | POA: Diagnosis present

## 2015-04-25 DIAGNOSIS — E119 Type 2 diabetes mellitus without complications: Secondary | ICD-10-CM | POA: Insufficient documentation

## 2015-04-25 DIAGNOSIS — Z6841 Body Mass Index (BMI) 40.0 and over, adult: Secondary | ICD-10-CM | POA: Insufficient documentation

## 2015-04-25 DIAGNOSIS — K219 Gastro-esophageal reflux disease without esophagitis: Secondary | ICD-10-CM | POA: Diagnosis not present

## 2015-04-25 DIAGNOSIS — E785 Hyperlipidemia, unspecified: Secondary | ICD-10-CM | POA: Diagnosis present

## 2015-04-25 DIAGNOSIS — R931 Abnormal findings on diagnostic imaging of heart and coronary circulation: Secondary | ICD-10-CM | POA: Diagnosis not present

## 2015-04-25 DIAGNOSIS — M13869 Other specified arthritis, unspecified knee: Secondary | ICD-10-CM | POA: Insufficient documentation

## 2015-04-25 DIAGNOSIS — Z79899 Other long term (current) drug therapy: Secondary | ICD-10-CM | POA: Insufficient documentation

## 2015-04-25 DIAGNOSIS — E669 Obesity, unspecified: Secondary | ICD-10-CM | POA: Diagnosis present

## 2015-04-25 DIAGNOSIS — R079 Chest pain, unspecified: Secondary | ICD-10-CM | POA: Diagnosis present

## 2015-04-25 DIAGNOSIS — G4733 Obstructive sleep apnea (adult) (pediatric): Secondary | ICD-10-CM | POA: Diagnosis not present

## 2015-04-25 DIAGNOSIS — I10 Essential (primary) hypertension: Secondary | ICD-10-CM | POA: Diagnosis not present

## 2015-04-25 DIAGNOSIS — I447 Left bundle-branch block, unspecified: Secondary | ICD-10-CM | POA: Diagnosis present

## 2015-04-25 HISTORY — PX: CARDIAC CATHETERIZATION: SHX172

## 2015-04-25 LAB — GLUCOSE, CAPILLARY: Glucose-Capillary: 188 mg/dL — ABNORMAL HIGH (ref 65–99)

## 2015-04-25 SURGERY — LEFT HEART CATH AND CORONARY ANGIOGRAPHY

## 2015-04-25 MED ORDER — SODIUM CHLORIDE 0.9 % IJ SOLN: 3.0000 mL | INTRAMUSCULAR | Status: DC | PRN

## 2015-04-25 MED ORDER — NITROGLYCERIN 1 MG/10 ML FOR IR/CATH LAB
INTRA_ARTERIAL | Status: AC
  Filled 2015-04-25: qty 10

## 2015-04-25 MED ORDER — METFORMIN HCL 500 MG PO TABS
1000.0000 mg | ORAL_TABLET | Freq: Two times a day (BID) | ORAL | Status: DC
Start: 2015-04-27 — End: 2015-04-25

## 2015-04-25 MED ORDER — SODIUM CHLORIDE 0.9 % WEIGHT BASED INFUSION: 1.0000 mL/kg/h | INTRAVENOUS | Status: DC

## 2015-04-25 MED ORDER — ASPIRIN 81 MG PO CHEW: 81.0000 mg | CHEWABLE_TABLET | ORAL | Status: DC

## 2015-04-25 MED ORDER — HEPARIN SODIUM (PORCINE) 1000 UNIT/ML IJ SOLN
INTRAMUSCULAR | Status: DC | PRN
  Administered 2015-04-25: 6000 [IU] via INTRAVENOUS

## 2015-04-25 MED ORDER — SODIUM CHLORIDE 0.9 % WEIGHT BASED INFUSION
3.0000 mL/kg/h | INTRAVENOUS | Status: DC
  Administered 2015-04-25: 3 mL/kg/h via INTRAVENOUS

## 2015-04-25 MED ORDER — LIDOCAINE HCL (PF) 1 % IJ SOLN
INTRAMUSCULAR | Status: DC | PRN
  Administered 2015-04-25: 5 mL via INTRADERMAL

## 2015-04-25 MED ORDER — VERAPAMIL HCL 2.5 MG/ML IV SOLN
INTRAVENOUS | Status: AC
  Filled 2015-04-25: qty 2

## 2015-04-25 MED ORDER — SODIUM CHLORIDE 0.9 % IJ SOLN: 3.0000 mL | Freq: Two times a day (BID) | INTRAMUSCULAR | Status: DC

## 2015-04-25 MED ORDER — MIDAZOLAM HCL 2 MG/2ML IJ SOLN
INTRAMUSCULAR | Status: DC | PRN
  Administered 2015-04-25: 2 mg via INTRAVENOUS

## 2015-04-25 MED ORDER — LIDOCAINE HCL (PF) 1 % IJ SOLN
INTRAMUSCULAR | Status: AC
  Filled 2015-04-25: qty 30

## 2015-04-25 MED ORDER — HEPARIN SODIUM (PORCINE) 1000 UNIT/ML IJ SOLN
INTRAMUSCULAR | Status: AC
  Filled 2015-04-25: qty 1

## 2015-04-25 MED ORDER — FENTANYL CITRATE (PF) 100 MCG/2ML IJ SOLN
INTRAMUSCULAR | Status: DC | PRN
  Administered 2015-04-25: 25 ug via INTRAVENOUS

## 2015-04-25 MED ORDER — SODIUM CHLORIDE 0.9 % IV SOLN: 250.0000 mL | INTRAVENOUS | Status: DC | PRN

## 2015-04-25 MED ORDER — VERAPAMIL HCL 2.5 MG/ML IV SOLN
INTRAVENOUS | Status: DC | PRN
  Administered 2015-04-25: 10:00:00 via INTRA_ARTERIAL

## 2015-04-25 MED ORDER — MIDAZOLAM HCL 2 MG/2ML IJ SOLN
INTRAMUSCULAR | Status: AC
  Filled 2015-04-25: qty 2

## 2015-04-25 MED ORDER — FENTANYL CITRATE (PF) 100 MCG/2ML IJ SOLN
INTRAMUSCULAR | Status: AC
  Filled 2015-04-25: qty 2

## 2015-04-25 MED ORDER — SODIUM CHLORIDE 0.9 % WEIGHT BASED INFUSION: 3.0000 mL/kg/h | INTRAVENOUS | Status: AC

## 2015-04-25 MED ORDER — HEPARIN (PORCINE) IN NACL 2-0.9 UNIT/ML-% IJ SOLN
INTRAMUSCULAR | Status: AC
  Filled 2015-04-25: qty 1000

## 2015-04-25 SURGICAL SUPPLY — 14 items

## 2015-04-25 NOTE — Discharge Instructions (Signed)
Radial Site Care °Refer to this sheet in the next few weeks. These instructions provide you with information on caring for yourself after your procedure. Your caregiver may also give you more specific instructions. Your treatment has been planned according to current medical practices, but problems sometimes occur. Call your caregiver if you have any problems or questions after your procedure. °HOME CARE INSTRUCTIONS °· You may shower the day after the procedure. Remove the bandage (dressing) and gently wash the site with plain soap and water. Gently pat the site dry. °· Do not apply powder or lotion to the site. °· Do not submerge the affected site in water for 3 to 5 days. °· Inspect the site at least twice daily. °· Do not flex or bend the affected arm for 24 hours. °· No lifting over 5 pounds (2.3 kg) for 5 days after your procedure. °· Do not drive home if you are discharged the same day of the procedure. Have someone else drive you. °· You may drive 24 hours after the procedure unless otherwise instructed by your caregiver. °· Do not operate machinery or power tools for 24 hours. °· A responsible adult should be with you for the first 24 hours after you arrive home. °What to expect: °· Any bruising will usually fade within 1 to 2 weeks. °· Blood that collects in the tissue (hematoma) may be painful to the touch. It should usually decrease in size and tenderness within 1 to 2 weeks. °SEEK IMMEDIATE MEDICAL CARE IF: °· You have unusual pain at the radial site. °· You have redness, warmth, swelling, or pain at the radial site. °· You have drainage (other than a small amount of blood on the dressing). °· You have chills. °· You have a fever or persistent symptoms for more than 72 hours. °· You have a fever and your symptoms suddenly get worse. °· Your arm becomes pale, cool, tingly, or numb. °· You have heavy bleeding from the site. Hold pressure on the site and call 911. °Document Released: 12/18/2010 Document  Revised: 02/07/2012 Document Reviewed: 12/18/2010 °ExitCare® Patient Information ©2015 [employer]. This information is not intended to replace advice given to you by your health care provider. Make sure you discuss any questions you have with your health care provider. ° °

## 2015-04-25 NOTE — H&P (View-Only) (Signed)
[name] [name] Date of Birth: 02-05-1956   History of Present Illness: [name] [name] it is seen as a work in at t72 year old white male who has a history of hypertension, diabetes mellitus, and hyperlipidemia. He has a chronic left bundle branch block. He has OSA and morbid obesity. He had cardiac evaluation in February 2011 with a nuclear stress test which was normal. Ejection fraction was 54%. He had an echocardiogram in 2008 which showed mild LVH but otherwise normal study. One month ago he experienced symptoms of chest pain in the mid chest anteriorly. He describes a tightness. He thought this was related to lifting a small stump in his yard and the symptoms gradually abated. He states when he had the pain he did not feel well at all and had no energy. The only other time he can recall having chest pain was last summer driving home from a doctor's visit when he suddenly had CP, sweating and nausea. No recent change in medical history.   Current Outpatient Prescriptions on File Prior to Visit  Medication Sig Dispense Refill  . Canagliflozin (INVOKANA) 100 MG TABS Take 100 mg by mouth every other day.     . Coenzyme Q10 10 MG capsule Take 10 mg by mouth daily.      . diclofenac (VOLTAREN) 75 MG EC tablet Take 1 tablet (75 mg total) by mouth 2 (two) times daily. 50 tablet 2  . EPINEPHrine (EPIPEN JR) 0.15 MG/0.3ML injection Inject 0.15 mg into the muscle as needed for anaphylaxis.     . fish oil-omega-3 fatty acids 1000 MG capsule Take 1 g by mouth daily.     . fluocinonide gel (LIDEX) 4.09 % Apply 1 application topically 2 (two) times daily as needed (itching).    Marland Kitchen ketotifen (EYE ITCH RELIEF) 0.025 % ophthalmic solution Place 1 drop into both eyes every morning.    . metFORMIN (GLUCOPHAGE) 500 MG tablet Take 500 mg by mouth 2 (two) times daily with a meal.     . Multiple Vitamin (MULTIVITAMIN) tablet Take 1 tablet by mouth daily.      . Olmesartan-Amlodipine-HCTZ (TRIBENZOR) 40-10-25 MG TABS  Take by mouth. ONE EVERY AM    . omeprazole (PRILOSEC) 20 MG capsule Take 20 mg by mouth daily.      Marland Kitchen oxyCODONE-acetaminophen (ROXICET) 5-325 MG per tablet Take 1-2 tablets by mouth every 4 (four) hours as needed for severe pain. 60 tablet 0  . potassium chloride SA (K-DUR,KLOR-CON) 20 MEQ tablet Take 20 mEq by mouth daily.    . rosuvastatin (CRESTOR) 10 MG tablet Take 5 mg by mouth daily.     . sertraline (ZOLOFT) 25 MG tablet Take 25 mg by mouth every morning.     . sildenafil (REVATIO) 20 MG tablet Take 20 mg by mouth as needed.    . Testosterone 10 MG/ACT (2%) GEL Place 1 application onto the skin at bedtime.     . triamcinolone cream (KENALOG) 0.5 % Apply 1 application topically 3 (three) times daily as needed (itching  FROM POISON IVY).     Marland Kitchen vitamin B-12 (CYANOCOBALAMIN) 1000 MCG tablet Take 1,000 mcg by mouth daily.      Marland Kitchen zinc gluconate 50 MG tablet Take 50 mg by mouth daily.       No current facility-administered medications on file prior to visit.    Allergies  Allergen Reactions  . Penicillins Hives and Rash    Past Medical History  Diagnosis Date  . LBBB (left bundle  branch block)   . ED (erectile dysfunction)   . Hyperlipidemia   . Hypertension   . Obesity   . Chest pain, atypical   . OSA (obstructive sleep apnea)     USES CPAP - DOES NOT KNOW SETTING  . Diabetes mellitus     ORAL MEDS  . GERD (gastroesophageal reflux disease)   . Arthritis     KNEES AND NECK; TORN MENISCUS RT KNEE  . Lichen planus     OF MOUTH  . H/O bee sting allergy     Past Surgical History  Procedure Laterality Date  . Cardiovascular stress test  01/16/2010    EF 54%  . US echocardiography  09/19/2007    EF 55-60%  . Vasectomy    . Sinus surgery with instatrak    . Anal fissure repair    . Knee arthroscopy Right 05/10/2014    Procedure: RIGHT KNEE ARTHROSCOPY WITH PARTIAL MEDIAL MENISCECTOMY;  Surgeon: Mcarthur Rossetti, MD;  Location: [facility];  Service: Orthopedics;   Laterality: Right;    History  Smoking status  . Never Smoker   Smokeless tobacco  . Never Used    History  Alcohol Use No    Family History  Problem Relation Age of Onset  . Hypertension Mother   . Kidney failure Father   . Heart disease Mother   . Heart disease Father   . Heart disease Sister   . Rheum arthritis Mother     Review of Systems: As noted in history of present illness.  All other systems were reviewed and are negative.  Physical Exam: BP 144/80 mmHg  Pulse 87  Ht 6' (1.829 m)  Wt 311 lb 6.4 oz (141.25 kg)  BMI 42.22 kg/m2 He is an obese white male in no acute distress. His HEENT exam is unremarkable. He has no JVD, adenopathy, thyromegaly, or bruits. Lungs are clear. Cardiac exam reveals a regular rate and rhythm without gallop, murmur, or click. Abdomen is obese and nontender. There are no masses or bruits. Extremities are without edema. He has good pedal pulses. Neurologic exam is nonfocal.  LABORATORY DATA: Ecg dated 04/03/15 reviewed and shows NSR with inferolateral T wave inversion consistent with ischemia. Possible septal infarct age undetermined.  Compared with Ecg 03/27/14 LBBB resolved.   Assessment / Plan: 1. Chest pain. Symptoms somewhat atypical but patient has multiple cardiac risk factors and is at least at intermediate risk. Ecg changes are difficult to characterize since before he had a LBBB. I have recommended a 2 day Lexiscan Myoview to assess further. If normal will reassure. If abnormal will need to consider a cardiac cath.  2. DM type 2 3. HTN 4. Dyslipidemia. 5. History of LBBB- not present on recent Ecg 6. Morbid obesity with OSA.

## 2015-04-25 NOTE — Interval H&P Note (Signed)
History and Physical Interval Note:  04/25/2015 10:00 AM  [name] [name]  has presented today for surgery, with the diagnosis of abnormal mioview  The various methods of treatment have been discussed with the patient and family. After consideration of risks, benefits and other options for treatment, the patient has consented to  Procedure(s): Left Heart Cath and Coronary Angiography (N/A) as a surgical intervention .  The patient's history has been reviewed, patient examined, no change in status, stable for surgery.  I have reviewed the patient's chart and labs.  Questions were answered to the patient's satisfaction.   Cath Lab Visit (complete for each Cath Lab visit)  Clinical Evaluation Leading to the Procedure:   ACS: No.  Non-ACS:    Anginal Classification: CCS III  Anti-ischemic medical therapy: Minimal Therapy (1 class of medications)  Non-Invasive Test Results: Intermediate-risk stress test findings: cardiac mortality 1-3%/year  Prior CABG: No previous CABG        Collier Salina [facility] 04/25/2015 10:00 AM

## 2015-04-29 MED FILL — Heparin Sodium (Porcine) 2 Unit/ML in Sodium Chloride 0.9%: INTRAMUSCULAR | Qty: 1000 | Status: AC

## 2015-05-09 ENCOUNTER — Ambulatory Visit: Payer: Self-pay | Admitting: Cardiology

## 2015-05-09 ENCOUNTER — Ambulatory Visit (INDEPENDENT_AMBULATORY_CARE_PROVIDER_SITE_OTHER): Payer: BLUE CROSS/BLUE SHIELD | Admitting: Cardiology

## 2015-05-09 ENCOUNTER — Encounter: Payer: Self-pay | Admitting: Cardiology

## 2015-05-09 VITALS — BP 130/76 | HR 84 | Ht 72.0 in | Wt 312.0 lb

## 2015-05-09 DIAGNOSIS — I447 Left bundle-branch block, unspecified: Secondary | ICD-10-CM

## 2015-05-09 DIAGNOSIS — E785 Hyperlipidemia, unspecified: Secondary | ICD-10-CM | POA: Diagnosis not present

## 2015-05-09 DIAGNOSIS — I10 Essential (primary) hypertension: Secondary | ICD-10-CM

## 2015-05-09 DIAGNOSIS — E669 Obesity, unspecified: Secondary | ICD-10-CM | POA: Diagnosis not present

## 2015-05-09 NOTE — Patient Instructions (Signed)
Continue your current therapy  Work on regular exercise and weight loss  I will see you as needed.

## 2015-05-09 NOTE — Progress Notes (Signed)
[name] [name] Date of Birth: 1956-10-05   History of Present Illness: Mr. Berendt it is seen for a post cath follow up.  He has a history of hypertension, diabetes mellitus, and hyperlipidemia. He has a chronic left bundle branch block. He has OSA and morbid obesity. He had cardiac evaluation in February 2011 with a nuclear stress test which was normal. Ejection fraction was 54%. More recently he had some chest pain and a Myoview study suggested anterior wall ischemia. He underwent cardiac cath which was normal. On follow up he has no complaints.  Current Outpatient Prescriptions on File Prior to Visit  Medication Sig Dispense Refill  . aspirin 81 MG tablet Take 81 mg by mouth daily.    . Canagliflozin (INVOKANA) 100 MG TABS Take 100 mg by mouth every other day.     . Coenzyme Q10 10 MG capsule Take 10 mg by mouth daily.      . diclofenac (VOLTAREN) 75 MG EC tablet Take 1 tablet (75 mg total) by mouth 2 (two) times daily. 50 tablet 2  . Dulaglutide 1.5 MG/0.5ML SOPN Inject 1.5 mg into the skin once a week. sunday    . EPINEPHrine (EPIPEN JR) 0.15 MG/0.3ML injection Inject 0.15 mg into the muscle as needed for anaphylaxis (bee venom).     . fluocinonide gel (LIDEX) 5.42 % Apply 1 application topically 2 (two) times daily as needed (itching).    Marland Kitchen ketotifen (EYE ITCH RELIEF) 0.025 % ophthalmic solution Place 1 drop into both eyes daily as needed (poison ivy).     . Multiple Vitamin (MULTIVITAMIN) tablet Take 1 tablet by mouth daily.      Marland Kitchen omeprazole (PRILOSEC) 20 MG capsule Take 20 mg by mouth daily.      . potassium chloride SA (K-DUR,KLOR-CON) 20 MEQ tablet Take 20 mEq by mouth daily.    . rosuvastatin (CRESTOR) 10 MG tablet Take 5 mg by mouth daily.     . sertraline (ZOLOFT) 25 MG tablet Take 25 mg by mouth every morning.     . sildenafil (REVATIO) 20 MG tablet Take 20 mg by mouth as needed (erectile dysfunction).     . traMADol (ULTRAM) 50 MG tablet Take 50 mg by mouth every 6 (six)  hours as needed for moderate pain.     Marland Kitchen triamcinolone cream (KENALOG) 0.5 % Apply 1 application topically 3 (three) times daily as needed (itching  FROM POISON IVY).     Marland Kitchen vitamin B-12 (CYANOCOBALAMIN) 1000 MCG tablet Take 1,000 mcg by mouth daily.      Marland Kitchen zinc gluconate 50 MG tablet Take 50 mg by mouth daily.       No current facility-administered medications on file prior to visit.    Allergies  Allergen Reactions  . Bee Venom Anaphylaxis  . Penicillins Hives and Rash    Past Medical History  Diagnosis Date  . LBBB (left bundle branch block)   . ED (erectile dysfunction)   . Hyperlipidemia   . Hypertension   . Obesity   . Chest pain, atypical   . OSA (obstructive sleep apnea)     USES CPAP - DOES NOT KNOW SETTING  . Diabetes mellitus     ORAL MEDS  . GERD (gastroesophageal reflux disease)   . Arthritis     KNEES AND NECK; TORN MENISCUS RT KNEE  . Lichen planus     OF MOUTH  . H/O bee sting allergy     Past Surgical History  Procedure Laterality Date  .  Cardiovascular stress test  01/16/2010    EF 54%  . US echocardiography  09/19/2007    EF 55-60%  . Vasectomy    . Sinus surgery with instatrak    . Anal fissure repair    . Knee arthroscopy Right 05/10/2014    Procedure: RIGHT KNEE ARTHROSCOPY WITH PARTIAL MEDIAL MENISCECTOMY;  Surgeon: Mcarthur Rossetti, MD;  Location: [facility];  Service: Orthopedics;  Laterality: Right;  . Cardiac catheterization N/A 04/25/2015    Procedure: Left Heart Cath and Coronary Angiography;  Surgeon: Peter M Martinique, MD;  Location: [place] [facility];  Service: Cardiovascular;  Laterality: N/A;    History  Smoking status  . Never Smoker   Smokeless tobacco  . Never Used    History  Alcohol Use No    Family History  Problem Relation Age of Onset  . Hypertension Mother   . Kidney failure Father   . Heart disease Mother   . Heart disease Father   . Heart disease Sister   . Rheum arthritis Mother     Review of Systems: As  noted in history of present illness.  All other systems were reviewed and are negative.  Physical Exam: BP 130/76 mmHg  Pulse 84  Ht 6' (1.829 m)  Wt 141.522 kg (312 lb)  BMI 42.31 kg/m2 He is an obese white male in no acute distress. His HEENT exam is unremarkable. He has no JVD, adenopathy, thyromegaly, or bruits. Lungs are clear. Cardiac exam reveals a regular rate and rhythm without gallop, murmur, or click. Abdomen is obese and nontender. There are no masses or bruits. Extremities are without edema. He has good pedal pulses. Radial cath site has healed well. Neurologic exam is nonfocal.  LABORATORY DATA: Cardiac cath 04/25/15: Conclusion     Normal coronary anatomy  Normal LV function  Recommendation: continue risk factor modification.     Coronary Findings    Dominance: Right   Left Main  The vessel , is normal in caliber is angiographically normal.     Left Anterior Descending  The vessel , is normal in caliber is angiographically normal.     Ramus Intermedius  The vessel , is large is angiographically normal. The vessel . trifurcating     Left Circumflex  The vessel , is normal in caliber is angiographically normal.     Right Coronary Artery  The vessel , is normal in caliber is angiographically normal.   . First Right Posterolateral   The vessel is small in size.   Marland Kitchen Second Right Posterolateral   The vessel is small in size.   . Third Right Posterolateral   The vessel is small in size.      Wall Motion                 Left Heart    Left Ventricle The left ventricular size is normal. The left ventricular systolic function is normal. The left ventricular ejection fraction is 55-65% by visual estimate. There are no wall motion abnormalities in the left ventricle.   Mitral Valve There is no mitral valve regurgitation.     Assessment / Plan: 1. Chest pain. Normal coronary anatomy by cath. Reviewed reasons for false positive Myoview.  Recommend continued risk factor modification.  2. DM type 2 3. HTN 4. Dyslipidemia. 5. History of LBBB 6. Morbid obesity with OSA.   I will follow up prn.

## 2015-05-13 NOTE — Telephone Encounter (Signed)
error 

## 2015-05-21 ENCOUNTER — Ambulatory Visit (AMBULATORY_SURGERY_CENTER): Payer: Self-pay | Admitting: *Deleted

## 2015-05-21 VITALS — Ht 72.0 in | Wt 313.6 lb

## 2015-05-21 DIAGNOSIS — Z86010 Personal history of colonic polyps: Secondary | ICD-10-CM

## 2015-05-21 MED ORDER — NA SULFATE-K SULFATE-MG SULF 17.5-3.13-1.6 GM/177ML PO SOLN: ORAL | Status: DC

## 2015-05-21 NOTE — Progress Notes (Signed)
No egg or soy allergy  No anesthesia or intubation problems per pt  No diet medications taken  Registered in EMMI   

## 2015-05-23 ENCOUNTER — Encounter: Payer: Self-pay | Admitting: Gastroenterology

## 2015-05-30 ENCOUNTER — Encounter: Payer: Self-pay | Admitting: Gastroenterology

## 2015-07-25 ENCOUNTER — Ambulatory Visit (AMBULATORY_SURGERY_CENTER): Payer: BLUE CROSS/BLUE SHIELD | Admitting: Gastroenterology

## 2015-07-25 ENCOUNTER — Encounter: Payer: Self-pay | Admitting: Gastroenterology

## 2015-07-25 VITALS — BP 128/73 | HR 80 | Temp 98.0°F | Resp 16 | Ht 72.0 in | Wt 313.0 lb

## 2015-07-25 DIAGNOSIS — Z86010 Personal history of colonic polyps: Secondary | ICD-10-CM

## 2015-07-25 LAB — GLUCOSE, CAPILLARY
Glucose-Capillary: 129 mg/dL — ABNORMAL HIGH (ref 65–99)
Glucose-Capillary: 134 mg/dL — ABNORMAL HIGH (ref 65–99)

## 2015-07-25 MED ORDER — SODIUM CHLORIDE 0.9 % IV SOLN
500.0000 mL | INTRAVENOUS | Status: DC
Start: 2015-07-25 — End: 2015-07-25

## 2015-07-25 NOTE — Op Note (Signed)
[place]  [address] [place], [id number]   COLONOSCOPY PROCEDURE REPORT  PATIENT: [name] [name]  MR#: [id number] BIRTHDATE: 1956-02-01 , 89  yrs. old GENDER: male ENDOSCOPIST: Ladene Artist, MD, [facility] REFERRED HC:WCBJ, Katherina Right MD PROCEDURE DATE:  07/25/2015 PROCEDURE:   Colonoscopy, surveillance First Screening Colonoscopy - Avg.  risk and is 50 yrs.  old or older - No.  Prior Negative Screening - Now for repeat screening. N/A  History of Adenoma - Now for follow-up colonoscopy & has been > or = to 3 yrs.  Yes hx of adenoma.  Has been 3 or more years since last colonoscopy.  Polyps removed today? No Recommend repeat exam, <10 yrs? No ASA CLASS:   Class III INDICATIONS:Surveillance due to prior colonic neoplasia and PH Colon Adenoma. MEDICATIONS: Monitored anesthesia care, Propofol 300 mg IV, and lidocaine 200 mg IV DESCRIPTION OF PROCEDURE:   After the risks benefits and alternatives of the procedure were thoroughly explained, informed consent was obtained.  The digital rectal exam revealed no abnormalities of the rectum.   The LB [id number]  endoscope was introduced through the anus and advanced to the cecum, which was identified by both the appendix and ileocecal valve. No adverse events experienced with a tortuous colon.   The quality of the prep was excellent.  (Suprep was used)  The instrument was then slowly withdrawn as the colon was fully examined. Estimated blood loss is zero unless otherwise noted in this procedure report.    COLON FINDINGS: A normal appearing cecum, ileocecal valve, and appendiceal orifice were identified.  The ascending, transverse, descending, sigmoid colon, and rectum appeared unremarkable. Retroflexed views revealed internal Grade I hemorrhoids. The time to cecum = 2.3 Withdrawal time = 12.3   The scope was withdrawn and the procedure completed. COMPLICATIONS: There were no immediate  complications.  ENDOSCOPIC IMPRESSION: 1.  Normal colonoscopy 2.  Small internal hemorrhoids  RECOMMENDATIONS: 1. Continue current colorectal screening recommendations for "routine risk" patients with a repeat colonoscopy in 10 years.  eSigned:  Ladene Artist, MD, [facility] 07/25/2015 1:46 PM

## 2015-07-25 NOTE — Progress Notes (Signed)
Report to PACU, RN, vss, BBS= Clear.  

## 2015-07-25 NOTE — Patient Instructions (Signed)
YOU HAD AN ENDOSCOPIC PROCEDURE TODAY AT THE [facility]:   Refer to the procedure report that was given to you for any specific questions about what was found during the examination.  If the procedure report does not answer your questions, please call your gastroenterologist to clarify.  If you requested that your care partner not be given the details of your procedure findings, then the procedure report has been included in a sealed envelope for you to review at your convenience later.  YOU SHOULD EXPECT: Some feelings of bloating in the abdomen. Passage of more gas than usual.  Walking can help get rid of the air that was put into your GI tract during the procedure and reduce the bloating. If you had a lower endoscopy (such as a colonoscopy or flexible sigmoidoscopy) you may notice spotting of blood in your stool or on the toilet paper. If you underwent a bowel prep for your procedure, you may not have a normal bowel movement for a few days.  Please Note:  You might notice some irritation and congestion in your nose or some drainage.  This is from the oxygen used during your procedure.  There is no need for concern and it should clear up in a day or so.  SYMPTOMS TO REPORT IMMEDIATELY:   Following lower endoscopy (colonoscopy or flexible sigmoidoscopy):  Excessive amounts of blood in the stool  Significant tenderness or worsening of abdominal pains  Swelling of the abdomen that is new, acute  Fever of 100F or higher   For urgent or emergent issues, a gastroenterologist can be reached at any hour by calling [phone].   DIET: Your first meal following the procedure should be a small meal and then it is ok to progress to your normal diet. Heavy or fried foods are harder to digest and may make you feel nauseous or bloated.  Likewise, meals heavy in dairy and vegetables can increase bloating.  Drink plenty of fluids but you should avoid alcoholic beverages for 24  hours.  ACTIVITY:  You should plan to take it easy for the rest of today and you should NOT DRIVE or use heavy machinery until tomorrow (because of the sedation medicines used during the test).    FOLLOW UP: Our staff will call the number listed on your records the next business day following your procedure to check on you and address any questions or concerns that you may have regarding the information given to you following your procedure. If we do not reach you, we will leave a message.  However, if you are feeling well and you are not experiencing any problems, there is no need to return our call.  We will assume that you have returned to your regular daily activities without incident.  If any biopsies were taken you will be contacted by phone or by letter within the next 1-3 weeks.  Please call us at [phone] if you have not heard about the biopsies in 3 weeks.    SIGNATURES/CONFIDENTIALITY: You and/or your care partner have signed paperwork which will be entered into your electronic medical record.  These signatures attest to the fact that that the information above on your After Visit Summary has been reviewed and is understood.  Full responsibility of the confidentiality of this discharge information lies with you and/or your care-partner. 

## 2015-07-28 ENCOUNTER — Telehealth: Payer: Self-pay | Admitting: *Deleted

## 2015-07-28 NOTE — Telephone Encounter (Signed)
  Follow up Call-  Call back number 07/25/2015  Post procedure Call Back phone  # [phone]  Permission to leave phone message Yes     Patient questions:  Do you have a fever, pain , or abdominal swelling? No. Pain Score  0 *  Have you tolerated food without any problems? Yes.    Have you been able to return to your normal activities? Yes.    Do you have any questions about your discharge instructions: Diet   No. Medications  No. Follow up visit  No.  Do you have questions or concerns about your Care? No.  Actions: * If pain score is 4 or above: No action needed, pain <4.

## 2016-03-31 IMAGING — CR DG CHEST 2V
2 series · 2 of 2 positions shown · non-contrast
Comparison: PA and lateral chest x-ray May 07, 2014

CLINICAL DATA: Preoperative study

EXAM:
CHEST  2 VIEW

[w chest pa]
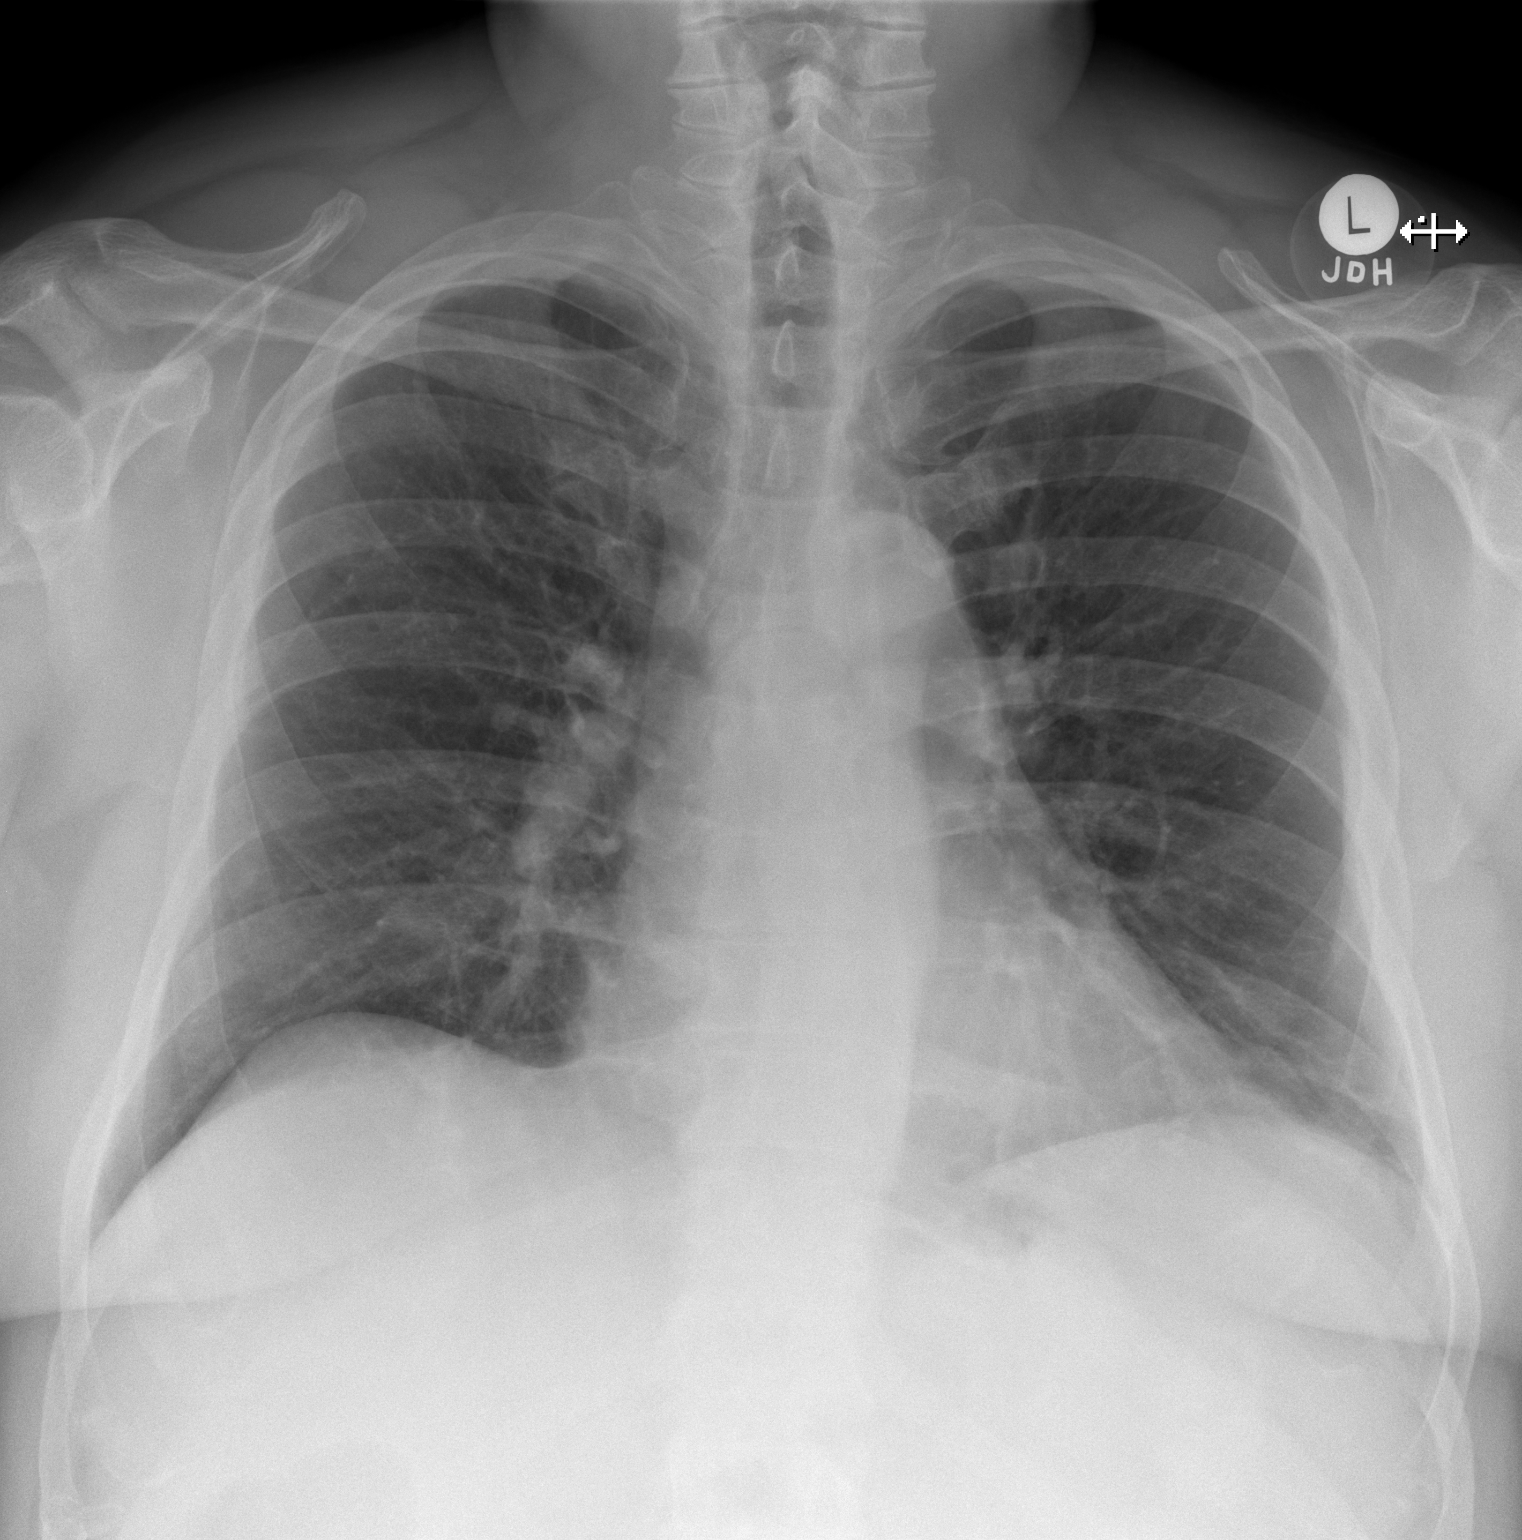

[w chest lat]
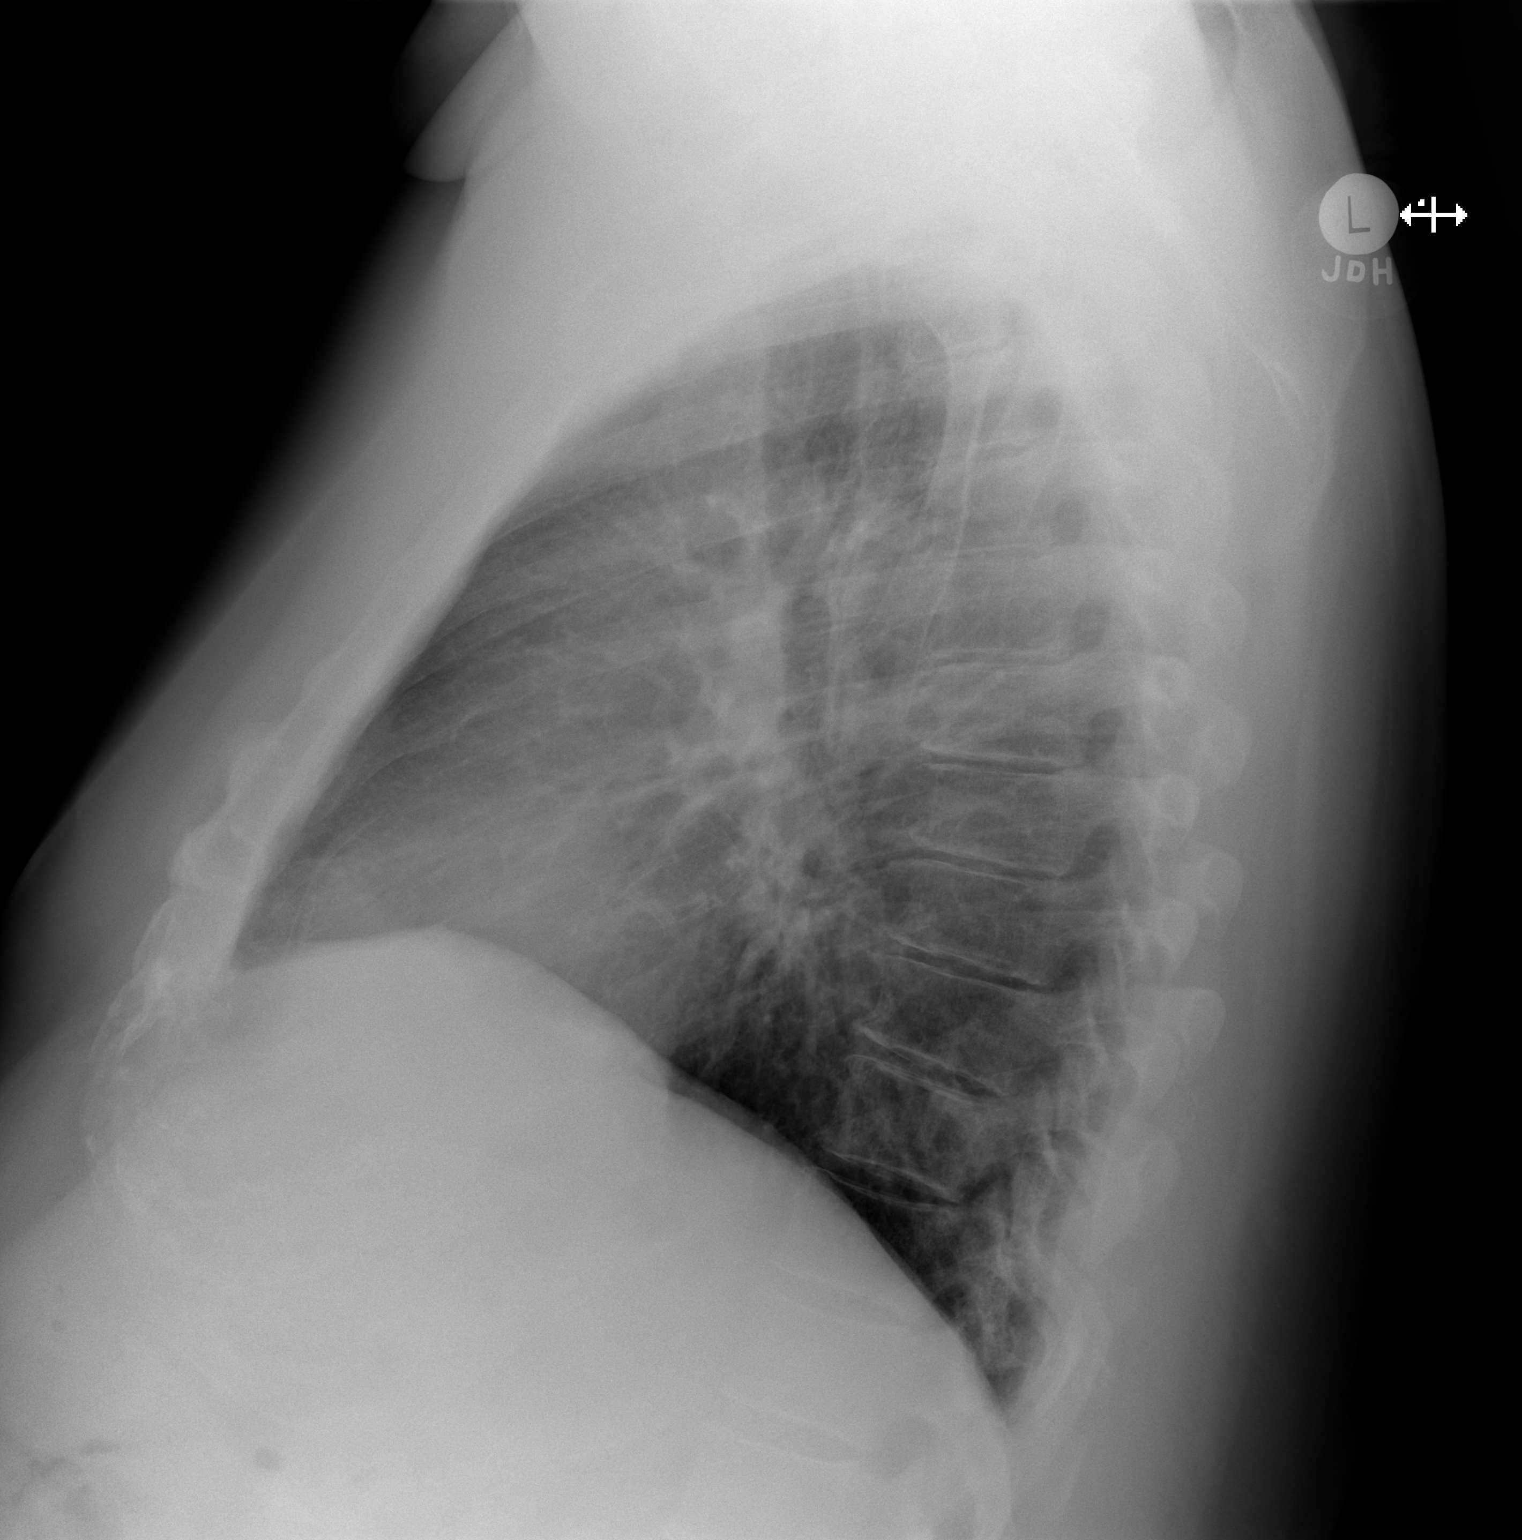

[2 of 2 positions shown; findings below may reference images not displayed]

FINDINGS: The lungs are adequately inflated and clear. The heart is top-normal
in size but stable. The pulmonary vascularity is within the limits
of normal. There is no pleural effusion. The mediastinum is normal
in width. The bony thorax exhibits no acute abnormality.
IMPRESSION: There is no active cardiopulmonary disease.

## 2016-04-13 DIAGNOSIS — E119 Type 2 diabetes mellitus without complications: Secondary | ICD-10-CM | POA: Diagnosis not present

## 2016-04-13 DIAGNOSIS — Z125 Encounter for screening for malignant neoplasm of prostate: Secondary | ICD-10-CM | POA: Diagnosis not present

## 2016-04-13 DIAGNOSIS — E784 Other hyperlipidemia: Secondary | ICD-10-CM | POA: Diagnosis not present

## 2016-04-13 DIAGNOSIS — Z0000 Encounter for general adult medical examination without abnormal findings: Secondary | ICD-10-CM | POA: Diagnosis not present

## 2016-04-20 DIAGNOSIS — E119 Type 2 diabetes mellitus without complications: Secondary | ICD-10-CM | POA: Diagnosis not present

## 2016-04-20 DIAGNOSIS — Z1389 Encounter for screening for other disorder: Secondary | ICD-10-CM | POA: Diagnosis not present

## 2016-04-20 DIAGNOSIS — Z23 Encounter for immunization: Secondary | ICD-10-CM | POA: Diagnosis not present

## 2016-04-20 DIAGNOSIS — Z6841 Body Mass Index (BMI) 40.0 and over, adult: Secondary | ICD-10-CM | POA: Diagnosis not present

## 2016-04-20 DIAGNOSIS — Z125 Encounter for screening for malignant neoplasm of prostate: Secondary | ICD-10-CM | POA: Diagnosis not present

## 2016-04-20 DIAGNOSIS — Z0000 Encounter for general adult medical examination without abnormal findings: Secondary | ICD-10-CM | POA: Diagnosis not present

## 2016-04-20 DIAGNOSIS — I10 Essential (primary) hypertension: Secondary | ICD-10-CM | POA: Diagnosis not present

## 2016-04-20 DIAGNOSIS — E784 Other hyperlipidemia: Secondary | ICD-10-CM | POA: Diagnosis not present

## 2016-06-29 DIAGNOSIS — G4733 Obstructive sleep apnea (adult) (pediatric): Secondary | ICD-10-CM | POA: Diagnosis not present

## 2016-09-15 DIAGNOSIS — E784 Other hyperlipidemia: Secondary | ICD-10-CM | POA: Diagnosis not present

## 2016-09-15 DIAGNOSIS — E119 Type 2 diabetes mellitus without complications: Secondary | ICD-10-CM | POA: Diagnosis not present

## 2016-09-17 DIAGNOSIS — G4733 Obstructive sleep apnea (adult) (pediatric): Secondary | ICD-10-CM | POA: Diagnosis not present

## 2016-09-17 DIAGNOSIS — I10 Essential (primary) hypertension: Secondary | ICD-10-CM | POA: Diagnosis not present

## 2016-09-17 DIAGNOSIS — E784 Other hyperlipidemia: Secondary | ICD-10-CM | POA: Diagnosis not present

## 2016-09-17 DIAGNOSIS — E119 Type 2 diabetes mellitus without complications: Secondary | ICD-10-CM | POA: Diagnosis not present

## 2016-09-17 DIAGNOSIS — Z23 Encounter for immunization: Secondary | ICD-10-CM | POA: Diagnosis not present

## 2016-09-22 ENCOUNTER — Encounter (INDEPENDENT_AMBULATORY_CARE_PROVIDER_SITE_OTHER): Payer: Self-pay | Admitting: Orthopaedic Surgery

## 2016-09-22 ENCOUNTER — Ambulatory Visit (INDEPENDENT_AMBULATORY_CARE_PROVIDER_SITE_OTHER): Payer: BLUE CROSS/BLUE SHIELD | Admitting: Orthopaedic Surgery

## 2016-09-22 ENCOUNTER — Ambulatory Visit (INDEPENDENT_AMBULATORY_CARE_PROVIDER_SITE_OTHER): Payer: BLUE CROSS/BLUE SHIELD

## 2016-09-22 DIAGNOSIS — M25561 Pain in right knee: Secondary | ICD-10-CM

## 2016-09-22 DIAGNOSIS — M25562 Pain in left knee: Secondary | ICD-10-CM | POA: Diagnosis not present

## 2016-09-22 DIAGNOSIS — G8929 Other chronic pain: Secondary | ICD-10-CM

## 2016-09-22 MED ORDER — METHYLPREDNISOLONE ACETATE 40 MG/ML IJ SUSP
40.0000 mg | INTRAMUSCULAR | Status: AC | PRN
  Administered 2016-09-22: 40 mg via INTRA_ARTICULAR

## 2016-09-22 MED ORDER — LIDOCAINE HCL 1 % IJ SOLN
1.0000 mL | INTRAMUSCULAR | Status: AC | PRN
  Administered 2016-09-22: 1 mL

## 2016-09-22 NOTE — Progress Notes (Signed)
   Procedure Note  Patient: [name] [name]             Date of Birth: 12/03/1955           MRN: [id number]             Visit Date: 09/22/2016  Procedures: Visit Diagnoses: Pain in both knees, unspecified chronicity - Plan: XR Knee 1-2 Views Left, XR Knee 1-2 Views Right  Chronic pain of left knee  Large Joint Inj Date/Time: 09/22/2016 1:56 PM Performed by: Anise Salvo Authorized by: Mcarthur Rossetti   Consent Given by:  Patient Indications:  Pain and joint swelling Location:  Knee Site:  R knee Needle Size:  22 G Needle Length:  1.5 inches Ultrasound Guidance: No   Fluoroscopic Guidance: No   Arthrogram: No Medications:  1 mL lidocaine 1 %; 40 mg methylPREDNISolone acetate 40 MG/ML Large Joint Inj Date/Time: 09/22/2016 1:57 PM Performed by: Anise Salvo Authorized by: Mcarthur Rossetti   Consent Given by:  Patient Indications:  Pain and joint swelling Location:  Knee Needle Size:  22 G Needle Length:  1.5 inches Ultrasound Guidance: No   Fluoroscopic Guidance: No   Arthrogram: No Medications:  1 mL lidocaine 1 %; 40 mg methylPREDNISolone acetate 40 MG/ML

## 2016-09-22 NOTE — Progress Notes (Signed)
Office Visit Note   Patient: [name] [name]           Date of Birth: 1956-11-17           MRN: [id number] Visit Date: 09/22/2016              Requested by: Marton Redwood, MD [address] [place], [place] [address] PCP: Marton Redwood, MD   Assessment & Plan: Visit Diagnoses:  1. Pain in both knees, unspecified chronicity   2. Chronic pain of left knee     Plan: [name] [name] was given information on hyaluronic acid injections such as Synvisc 1 or monovisc.  I was able to put steroid injection in each knee. He tolerated these well. I did discuss the risk and benefits of injections and he is a diabetic. He reports that his blood glucose has been running a little high but that his primary care physician said that it was okay to get injections in both his knees. He said that he can follow-up when necessary and will let us know if he wants to try other injections. He is going to try to Tumeric.  Follow-Up Instructions: Return if symptoms worsen or fail to improve.   Orders:  Orders Placed This Encounter  Procedures  . Large Joint Injection/Arthrocentesis  . Large Joint Injection/Arthrocentesis  . XR Knee 1-2 Views Left  . XR Knee 1-2 Views Right   No orders of the defined types were placed in this encounter.     Procedures: No procedures performed   Clinical Data: No additional findings.   Subjective: Chief Complaint  Patient presents with  . Left Knee - Pain, Weakness    States pretty constant pain x1 month +swelling/weakness Taking tramadol from PCP  . Right Knee - Weakness, Pain    States pretty constant pain x1 month +swelling/weakness Taking tramadol from PCP    HPI  Review of Systems   Objective: Vital Signs: There were no vitals taken for this visit.  Physical Exam  Musculoskeletal:       Right knee: Tenderness found. Medial joint line and lateral joint line tenderness noted.       Left knee: Tenderness found. Medial joint line and lateral joint line  tenderness noted.    Ortho Exam  Specialty Comments:  No specialty comments available.  Imaging: Xr Knee 1-2 Views Left  Result Date: 09/22/2016 2 views of the left knee AP and lateral shows good alignment well maintained joint spaces and no obvious acute bony injury  Xr Knee 1-2 Views Right  Result Date: 09/22/2016 X-rays of the right knee including AP and lateral still show well-maintained joint spaces there is no effusion in no acute fracture or malalignment issues    PMFS History: Patient Active Problem List   Diagnosis Date Noted  . Abnormal nuclear stress test 04/25/2015  . Chest pain 04/04/2015  . Right knee meniscal tear 05/10/2014  . LBBB (left bundle branch block)   . Obesity   . DM 10/16/2008  . Obstructive sleep apnea 10/16/2008  . ALLERGIC RHINITIS 10/16/2008  . Hyperlipidemia 10/15/2008  . Essential hypertension 10/15/2008   Past Medical History:  Diagnosis Date  . Arthritis    KNEES AND NECK; TORN MENISCUS RT KNEE  . Chest pain, atypical   . Diabetes mellitus    ORAL MEDS  . ED (erectile dysfunction)   . GERD (gastroesophageal reflux disease)   . H/O bee sting allergy   . Hyperlipidemia   . Hypertension   .  LBBB (left bundle branch block)   . Lichen planus    OF MOUTH  . Obesity   . OSA (obstructive sleep apnea)    USES CPAP - DOES NOT KNOW SETTING  . Sleep apnea    wears CPAP    Family History  Problem Relation Age of Onset  . Hypertension Mother   . Heart disease Mother   . Rheum arthritis Mother   . Kidney failure Father   . Heart disease Father   . Kidney disease Father   . Heart disease Sister   . Colon cancer Neg Hx   . Esophageal cancer Neg Hx   . Stomach cancer Neg Hx   . Rectal cancer Neg Hx     Past Surgical History:  Procedure Laterality Date  . ANAL FISSURE REPAIR    . CARDIAC CATHETERIZATION N/A 04/25/2015   Procedure: Left Heart Cath and Coronary Angiography;  Surgeon: Peter M Martinique, MD;  Location: [place] [facility];  Service: Cardiovascular;  Laterality: N/A;  . CARDIOVASCULAR STRESS TEST  01/16/2010   EF 54%  . COLONOSCOPY    . KNEE ARTHROSCOPY Right 05/10/2014   Procedure: RIGHT KNEE ARTHROSCOPY WITH PARTIAL MEDIAL MENISCECTOMY;  Surgeon: Mcarthur Rossetti, MD;  Location: [facility];  Service: Orthopedics;  Laterality: Right;  . SINUS SURGERY WITH INSTATRAK    . US ECHOCARDIOGRAPHY  09/19/2007   EF 55-60%  . VASECTOMY     Social History   Occupational History  . Sales Rep     Social History Main Topics  . Smoking status: Never Smoker  . Smokeless tobacco: Never Used  . Alcohol use No  . Drug use: No  . Sexual activity: Not on file

## 2016-11-02 DIAGNOSIS — I10 Essential (primary) hypertension: Secondary | ICD-10-CM | POA: Diagnosis not present

## 2016-11-02 DIAGNOSIS — Z6841 Body Mass Index (BMI) 40.0 and over, adult: Secondary | ICD-10-CM | POA: Diagnosis not present

## 2016-11-02 DIAGNOSIS — E119 Type 2 diabetes mellitus without complications: Secondary | ICD-10-CM | POA: Diagnosis not present

## 2016-11-05 DIAGNOSIS — H524 Presbyopia: Secondary | ICD-10-CM | POA: Diagnosis not present

## 2016-11-05 DIAGNOSIS — H52203 Unspecified astigmatism, bilateral: Secondary | ICD-10-CM | POA: Diagnosis not present

## 2016-11-05 DIAGNOSIS — E119 Type 2 diabetes mellitus without complications: Secondary | ICD-10-CM | POA: Diagnosis not present

## 2016-11-25 DIAGNOSIS — E119 Type 2 diabetes mellitus without complications: Secondary | ICD-10-CM | POA: Diagnosis not present

## 2016-11-25 DIAGNOSIS — R229 Localized swelling, mass and lump, unspecified: Secondary | ICD-10-CM | POA: Diagnosis not present

## 2016-11-25 DIAGNOSIS — Z6841 Body Mass Index (BMI) 40.0 and over, adult: Secondary | ICD-10-CM | POA: Diagnosis not present

## 2016-12-22 DIAGNOSIS — E119 Type 2 diabetes mellitus without complications: Secondary | ICD-10-CM | POA: Diagnosis not present

## 2016-12-22 DIAGNOSIS — I10 Essential (primary) hypertension: Secondary | ICD-10-CM | POA: Diagnosis not present

## 2016-12-22 DIAGNOSIS — E784 Other hyperlipidemia: Secondary | ICD-10-CM | POA: Diagnosis not present

## 2016-12-22 DIAGNOSIS — Z1389 Encounter for screening for other disorder: Secondary | ICD-10-CM | POA: Diagnosis not present

## 2017-03-24 DIAGNOSIS — D2261 Melanocytic nevi of right upper limb, including shoulder: Secondary | ICD-10-CM | POA: Diagnosis not present

## 2017-03-24 DIAGNOSIS — D1801 Hemangioma of skin and subcutaneous tissue: Secondary | ICD-10-CM | POA: Diagnosis not present

## 2017-03-24 DIAGNOSIS — L918 Other hypertrophic disorders of the skin: Secondary | ICD-10-CM | POA: Diagnosis not present

## 2017-03-24 DIAGNOSIS — L570 Actinic keratosis: Secondary | ICD-10-CM | POA: Diagnosis not present

## 2017-03-24 DIAGNOSIS — L821 Other seborrheic keratosis: Secondary | ICD-10-CM | POA: Diagnosis not present

## 2017-04-19 DIAGNOSIS — I10 Essential (primary) hypertension: Secondary | ICD-10-CM | POA: Diagnosis not present

## 2017-04-19 DIAGNOSIS — Z125 Encounter for screening for malignant neoplasm of prostate: Secondary | ICD-10-CM | POA: Diagnosis not present

## 2017-04-19 DIAGNOSIS — N390 Urinary tract infection, site not specified: Secondary | ICD-10-CM | POA: Diagnosis not present

## 2017-04-19 DIAGNOSIS — E784 Other hyperlipidemia: Secondary | ICD-10-CM | POA: Diagnosis not present

## 2017-04-19 DIAGNOSIS — E119 Type 2 diabetes mellitus without complications: Secondary | ICD-10-CM | POA: Diagnosis not present

## 2017-04-19 DIAGNOSIS — R8299 Other abnormal findings in urine: Secondary | ICD-10-CM | POA: Diagnosis not present

## 2017-04-28 DIAGNOSIS — E784 Other hyperlipidemia: Secondary | ICD-10-CM | POA: Diagnosis not present

## 2017-04-28 DIAGNOSIS — I10 Essential (primary) hypertension: Secondary | ICD-10-CM | POA: Diagnosis not present

## 2017-04-28 DIAGNOSIS — Z0000 Encounter for general adult medical examination without abnormal findings: Secondary | ICD-10-CM | POA: Diagnosis not present

## 2017-04-28 DIAGNOSIS — G4733 Obstructive sleep apnea (adult) (pediatric): Secondary | ICD-10-CM | POA: Diagnosis not present

## 2017-04-28 DIAGNOSIS — E119 Type 2 diabetes mellitus without complications: Secondary | ICD-10-CM | POA: Diagnosis not present

## 2017-04-28 DIAGNOSIS — Z1389 Encounter for screening for other disorder: Secondary | ICD-10-CM | POA: Diagnosis not present

## 2017-07-19 DIAGNOSIS — E119 Type 2 diabetes mellitus without complications: Secondary | ICD-10-CM | POA: Diagnosis not present

## 2017-07-19 DIAGNOSIS — Z6841 Body Mass Index (BMI) 40.0 and over, adult: Secondary | ICD-10-CM | POA: Diagnosis not present

## 2017-07-19 DIAGNOSIS — I10 Essential (primary) hypertension: Secondary | ICD-10-CM | POA: Diagnosis not present

## 2017-08-31 DIAGNOSIS — E7849 Other hyperlipidemia: Secondary | ICD-10-CM | POA: Diagnosis not present

## 2017-08-31 DIAGNOSIS — G4733 Obstructive sleep apnea (adult) (pediatric): Secondary | ICD-10-CM | POA: Diagnosis not present

## 2017-08-31 DIAGNOSIS — E119 Type 2 diabetes mellitus without complications: Secondary | ICD-10-CM | POA: Diagnosis not present

## 2017-08-31 DIAGNOSIS — I10 Essential (primary) hypertension: Secondary | ICD-10-CM | POA: Diagnosis not present

## 2017-11-04 DIAGNOSIS — B078 Other viral warts: Secondary | ICD-10-CM | POA: Diagnosis not present

## 2017-11-04 DIAGNOSIS — L570 Actinic keratosis: Secondary | ICD-10-CM | POA: Diagnosis not present

## 2017-12-22 DIAGNOSIS — Z6841 Body Mass Index (BMI) 40.0 and over, adult: Secondary | ICD-10-CM | POA: Diagnosis not present

## 2017-12-22 DIAGNOSIS — I447 Left bundle-branch block, unspecified: Secondary | ICD-10-CM | POA: Diagnosis not present

## 2017-12-22 DIAGNOSIS — R0789 Other chest pain: Secondary | ICD-10-CM | POA: Diagnosis not present

## 2018-01-19 DIAGNOSIS — E119 Type 2 diabetes mellitus without complications: Secondary | ICD-10-CM | POA: Diagnosis not present

## 2018-01-19 DIAGNOSIS — G4733 Obstructive sleep apnea (adult) (pediatric): Secondary | ICD-10-CM | POA: Diagnosis not present

## 2018-01-19 DIAGNOSIS — I10 Essential (primary) hypertension: Secondary | ICD-10-CM | POA: Diagnosis not present

## 2018-01-19 DIAGNOSIS — E7849 Other hyperlipidemia: Secondary | ICD-10-CM | POA: Diagnosis not present

## 2018-05-17 DIAGNOSIS — D225 Melanocytic nevi of trunk: Secondary | ICD-10-CM | POA: Diagnosis not present

## 2018-05-17 DIAGNOSIS — L720 Epidermal cyst: Secondary | ICD-10-CM | POA: Diagnosis not present

## 2018-05-17 DIAGNOSIS — D485 Neoplasm of uncertain behavior of skin: Secondary | ICD-10-CM | POA: Diagnosis not present

## 2018-05-17 DIAGNOSIS — L570 Actinic keratosis: Secondary | ICD-10-CM | POA: Diagnosis not present

## 2018-05-17 DIAGNOSIS — L918 Other hypertrophic disorders of the skin: Secondary | ICD-10-CM | POA: Diagnosis not present

## 2018-05-17 DIAGNOSIS — L821 Other seborrheic keratosis: Secondary | ICD-10-CM | POA: Diagnosis not present

## 2018-05-17 DIAGNOSIS — D1801 Hemangioma of skin and subcutaneous tissue: Secondary | ICD-10-CM | POA: Diagnosis not present

## 2018-05-31 DIAGNOSIS — E119 Type 2 diabetes mellitus without complications: Secondary | ICD-10-CM | POA: Diagnosis not present

## 2018-05-31 DIAGNOSIS — I10 Essential (primary) hypertension: Secondary | ICD-10-CM | POA: Diagnosis not present

## 2018-05-31 DIAGNOSIS — R82998 Other abnormal findings in urine: Secondary | ICD-10-CM | POA: Diagnosis not present

## 2018-05-31 DIAGNOSIS — E7849 Other hyperlipidemia: Secondary | ICD-10-CM | POA: Diagnosis not present

## 2018-05-31 DIAGNOSIS — Z125 Encounter for screening for malignant neoplasm of prostate: Secondary | ICD-10-CM | POA: Diagnosis not present

## 2018-06-07 DIAGNOSIS — E7849 Other hyperlipidemia: Secondary | ICD-10-CM | POA: Diagnosis not present

## 2018-06-07 DIAGNOSIS — E119 Type 2 diabetes mellitus without complications: Secondary | ICD-10-CM | POA: Diagnosis not present

## 2018-06-07 DIAGNOSIS — Z0000 Encounter for general adult medical examination without abnormal findings: Secondary | ICD-10-CM | POA: Diagnosis not present

## 2018-06-07 DIAGNOSIS — I10 Essential (primary) hypertension: Secondary | ICD-10-CM | POA: Diagnosis not present

## 2018-06-07 DIAGNOSIS — Z1389 Encounter for screening for other disorder: Secondary | ICD-10-CM | POA: Diagnosis not present

## 2018-06-07 DIAGNOSIS — G4733 Obstructive sleep apnea (adult) (pediatric): Secondary | ICD-10-CM | POA: Diagnosis not present

## 2018-12-08 DIAGNOSIS — E119 Type 2 diabetes mellitus without complications: Secondary | ICD-10-CM | POA: Diagnosis not present

## 2018-12-08 DIAGNOSIS — I10 Essential (primary) hypertension: Secondary | ICD-10-CM | POA: Diagnosis not present

## 2018-12-08 DIAGNOSIS — G4733 Obstructive sleep apnea (adult) (pediatric): Secondary | ICD-10-CM | POA: Diagnosis not present

## 2018-12-08 DIAGNOSIS — E7849 Other hyperlipidemia: Secondary | ICD-10-CM | POA: Diagnosis not present

## 2019-01-25 ENCOUNTER — Ambulatory Visit (INDEPENDENT_AMBULATORY_CARE_PROVIDER_SITE_OTHER): Payer: BLUE CROSS/BLUE SHIELD | Admitting: Neurology

## 2019-01-25 ENCOUNTER — Encounter: Payer: Self-pay | Admitting: Neurology

## 2019-01-25 VITALS — BP 154/87 | HR 79 | Ht 72.0 in | Wt 298.0 lb

## 2019-01-25 DIAGNOSIS — R351 Nocturia: Secondary | ICD-10-CM

## 2019-01-25 DIAGNOSIS — Z9989 Dependence on other enabling machines and devices: Secondary | ICD-10-CM

## 2019-01-25 DIAGNOSIS — G4733 Obstructive sleep apnea (adult) (pediatric): Secondary | ICD-10-CM | POA: Diagnosis not present

## 2019-01-25 DIAGNOSIS — Z6841 Body Mass Index (BMI) 40.0 and over, adult: Secondary | ICD-10-CM

## 2019-01-25 NOTE — Progress Notes (Signed)
Subjective:    Patient ID: [name] [name] is a 63 y.o. male.  HPI     Star Age, MD, PhD [facility] [address] [place], [place] [address]  Dear Dr. Brigitte Pulse,   I saw your patient, [name] [name], upon your kind request in the sleep clinic today for initial consultation of his sleep disorder, in particular, evaluation of his prior diagnosis of obstructive sleep apnea. The patient is unaccompanied today. As you know, [name] is a 63 year old left-handed gentleman with an underlying medical history of diabetes, hypertension, hyperlipidemia, ED, and obesity, who was previously diagnosed with obstructive sleep apnea and placed on CPAP therapy. I reviewed your office note from 12/08/2018, which you kindly included. He had a sleep study in 2000 for which I was able to review. This was a split-night sleep study. He was noted to have loud snoring. Total AHI was not reported. He was titrated on CPAP up to a pressure of 14 cm. A CPAP compliance download was not available for my review today. The patient has also seen Dr. Gwenette Greet in the past. He reports compliance with his CPAP but he is unable to get new supplies. His current DME company is [facility]. His machine is older, about 65 years old. His Epworth sleepiness score is 5 out of 24 today, fatigue score is 18 out of 63. He is married and lives with his wife. They have 2 children. He is a nonsmoker and does not utilize alcohol and drinks caffeine in the form of coffee, 3-4 cups per day in the mornings typically. 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 He plans to retire in a year or year and a half. He lives with his wife, they have 2 daughters, one dog in the household concurrently 1 cat. He does not watch TV in the bedroom. He has nocturia about twice per average night and denies recurrent morning headaches, has had the occasional morning headache in the context of sinus congestion. He believes that his  father had sleep apnea but was not on CPAP therapy. His bedtime is around 9, rise time around 6 or 7 currently. He is trying to lose weight. He has lost about 25 pounds within the last year. He had a tonsillectomy at age 63. He reports numbness in the left more than right hand especially his fingers, especially exacerbated with typing. He has more symptoms at night also, left more than right. He's wondering if he has carpal tunnel syndrome.  His Past Medical History Is Significant For: Past Medical History:  Diagnosis Date  . Arthritis    KNEES AND NECK; TORN MENISCUS RT KNEE  . Chest pain, atypical   . Diabetes mellitus    ORAL MEDS  . ED (erectile dysfunction)   . GERD (gastroesophageal reflux disease)   . H/O bee sting allergy   . Hyperlipidemia   . Hypertension   . LBBB (left bundle branch block)   . Lichen planus    OF MOUTH  . Obesity   . OSA (obstructive sleep apnea)    USES CPAP - DOES NOT KNOW SETTING  . Sleep apnea    wears CPAP    His Past Surgical History Is Significant For: Past Surgical History:  Procedure Laterality Date  . ANAL FISSURE REPAIR    . CARDIAC CATHETERIZATION N/A 04/25/2015   Procedure: Left Heart Cath and Coronary Angiography;  Surgeon: Peter M Martinique, MD;  Location: [facility];  Service:  Cardiovascular;  Laterality: N/A;  . CARDIOVASCULAR STRESS TEST  01/16/2010   EF 54%  . COLONOSCOPY    . KNEE ARTHROSCOPY Right 05/10/2014   Procedure: RIGHT KNEE ARTHROSCOPY WITH PARTIAL MEDIAL MENISCECTOMY;  Surgeon: Mcarthur Rossetti, MD;  Location: [facility];  Service: Orthopedics;  Laterality: Right;  . SINUS SURGERY WITH INSTATRAK    . US ECHOCARDIOGRAPHY  09/19/2007   EF 55-60%  . VASECTOMY      His Family History Is Significant For: Family History  Problem Relation Age of Onset  . Hypertension Mother   . Heart disease Mother   . Rheum arthritis Mother   . Kidney failure Father   . Heart disease Father   . Kidney disease Father   . Heart  disease Sister   . Colon cancer Neg Hx   . Esophageal cancer Neg Hx   . Stomach cancer Neg Hx   . Rectal cancer Neg Hx     His Social History Is Significant For: Social History   Socioeconomic History  . Marital status: Married    Spouse name: Pamala Hurry  . Number of children: Y  . Years of education: Not on file  . Highest education level: Not on file  Occupational History  . Occupation: Press photographer Rep   Social Needs  . Financial resource strain: Not on file  . Food insecurity:    Worry: Not on file    Inability: Not on file  . Transportation needs:    Medical: Not on file    Non-medical: Not on file  Tobacco Use  . Smoking status: Never Smoker  . Smokeless tobacco: Never Used  Substance and Sexual Activity  . Alcohol use: No    Alcohol/week: 0.0 standard drinks  . Drug use: No  . Sexual activity: Not on file  Lifestyle  . Physical activity:    Days per week: Not on file    Minutes per session: Not on file  . Stress: Not on file  Relationships  . Social connections:    Talks on phone: Not on file    Gets together: Not on file    Attends religious service: Not on file    Active member of club or organization: Not on file    Attends meetings of clubs or organizations: Not on file    Relationship status: Not on file  Other Topics Concern  . Not on file  Social History Narrative  . Not on file    His Allergies Are:  Allergies  Allergen Reactions  . Bee Venom Anaphylaxis  . Penicillins Hives and Rash  :   His Current Medications Are:  Outpatient Encounter Medications as of 01/25/2019  Medication Sig  . amLODipine (NORVASC) 10 MG tablet Take 10 mg by mouth daily.  Marland Kitchen aspirin 81 MG tablet Take 81 mg by mouth daily.  . diclofenac (VOLTAREN) 75 MG EC tablet Take 1 tablet (75 mg total) by mouth 2 (two) times daily.  . Dulaglutide 1.5 MG/0.5ML SOPN Inject 1.5 mg into the skin once a week. sunday  . EPINEPHrine (EPIPEN JR) 0.15 MG/0.3ML injection Inject 0.15 mg into the  muscle as needed for anaphylaxis (bee venom).   . Multiple Vitamin (MULTIVITAMIN) tablet Take 1 tablet by mouth daily.    Marland Kitchen omeprazole (PRILOSEC) 20 MG capsule Take 20 mg by mouth daily.    . rosuvastatin (CRESTOR) 10 MG tablet Take 5 mg by mouth daily.   . sertraline (ZOLOFT) 25 MG tablet Take 25 mg by mouth  every morning.   Marland Kitchen telmisartan-hydrochlorothiazide (MICARDIS HCT) 80-25 MG tablet Take 1 tablet by mouth daily.  . traMADol (ULTRAM) 50 MG tablet Take 50 mg by mouth every 6 (six) hours as needed for moderate pain.   . [DISCONTINUED] fluocinonide gel (LIDEX) 4.65 % Apply 1 application topically 2 (two) times daily as needed (itching).  . [DISCONTINUED] olmesartan (BENICAR) 40 MG tablet Take 40 mg by mouth daily.  . [DISCONTINUED] potassium chloride SA (K-DUR,KLOR-CON) 20 MEQ tablet Take 20 mEq by mouth daily.  . [DISCONTINUED] triamcinolone cream (KENALOG) 0.5 % Apply 1 application topically 3 (three) times daily as needed (itching  FROM POISON IVY).   . [DISCONTINUED] zinc gluconate 50 MG tablet Take 50 mg by mouth daily.    . sildenafil (REVATIO) 20 MG tablet Take 20 mg by mouth as needed (erectile dysfunction).   . [DISCONTINUED] Canagliflozin (INVOKANA) 100 MG TABS Take 100 mg by mouth every other day.   . [DISCONTINUED] Coenzyme Q10 10 MG capsule Take 10 mg by mouth daily.    . [DISCONTINUED] hydrochlorothiazide (HYDRODIURIL) 25 MG tablet Take 25 mg by mouth daily.  . [DISCONTINUED] ketotifen (EYE ITCH RELIEF) 0.025 % ophthalmic solution Place 1 drop into both eyes daily as needed (poison ivy).   . [DISCONTINUED] vitamin B-12 (CYANOCOBALAMIN) 1000 MCG tablet Take 1,000 mcg by mouth daily.     No facility-administered encounter medications on file as of 01/25/2019.   :  Review of Systems:  Out of a complete 14 point review of systems, all are reviewed and negative with the exception of these symptoms as listed below: Review of Systems  Neurological:       Pt presents today to  discuss his cpap. His current cpap is about 65 years old. He reports that he uses it nightly. Pt reports that he is unable to get a new cpap or supplies through [facility] and is wondering if he will need another sleep study.  Epworth Sleepiness Scale 0= would never doze 1= slight chance of dozing 2= moderate chance of dozing 3= high chance of dozing  Sitting and reading: 1 Watching TV: 1 Sitting inactive in a public place (ex. Theater or meeting): 0 As a passenger in a car for an hour without a break: 0 Lying down to rest in the afternoon: 3 Sitting and talking to someone: 0 Sitting quietly after lunch (no alcohol): 0 In a car, while stopped in traffic: 0 Total: 5     Objective:  Neurological Exam  Physical Exam Physical Examination:   Vitals:   01/25/19 1334  BP: (!) 154/87  Pulse: 79   General Examination: The patient is a very pleasant 63 y.o. male in no acute distress. He appears well-developed and well-nourished and well groomed.   HEENT: Normocephalic, atraumatic, pupils are equal, round and reactive to light and accommodation. Extraocular tracking is good without limitation to gaze excursion or nystagmus noted. Normal smooth pursuit is noted. Hearing is is mildly impaired. Face is symmetric. Speech is clear. No nuchal rigidity noted. No carotid bruits. Airway examination reveals mild mouth dryness, adequate dental hygiene, moderate airway crowding secondary to smaller airway entry and redundant soft palate. Mallampati is class III. Tongue protrudes centrally and palate elevates symmetrically. Neck circumference is 19-1/2 inches.  Chest: Clear to auscultation without wheezing, rhonchi or crackles noted.  Heart: S1+S2+0, regular and normal without murmurs, rubs or gallops noted.   Abdomen: Soft, non-tender and non-distended with normal bowel sounds appreciated on auscultation.  Extremities: There is no  pitting edema in the distal lower extremities bilaterally.   Skin: Warm  and dry without trophic changes noted.  Musculoskeletal: exam reveals no obvious joint deformities, tenderness or joint swelling or erythema.   Neurologically:  Mental status: The patient is awake, alert and oriented in all 4 spheres. His immediate and remote memory, attention, language skills and fund of knowledge are appropriate. There is no evidence of aphasia, agnosia, apraxia or anomia. Speech is clear with normal prosody and enunciation. Thought process is linear. Mood is normal and affect is normal.  Cranial nerves II - XII are as described above under HEENT exam. In addition: shoulder shrug is normal with equal shoulder height noted. Motor exam: Normal bulk, strength and tone is noted. No obvious thenar atrophy. There is no drift, tremor or rebound. Romberg is negative. Reflexes are 1+ throughout. Fine motor skills and coordination: intact with normal finger taps, normal hand movements, normal rapid alternating patting, normal foot taps and normal foot agility.  Cerebellar testing: No dysmetria or intention tremor. There is no truncal or gait ataxia.  Sensory exam: intact to light touch in the upper and lower extremities.  Gait, station and balance: He stands easily. No veering to one side is noted. No leaning to one side is noted. Posture is age-appropriate and stance is narrow based. Gait shows normal stride length and normal pace. No problems turning are noted. Tandem walk is unremarkable.   Assessment and Plan:  In summary, Bj Morlock is a very pleasant 63 y.o.-year old male with an underlying medical history of diabetes, hypertension, hyperlipidemia, ED, and obesity, who presents for evaluation of his obstructive sleep apnea. He was diagnosed over 15 years ago. He has an older CPAP machine and would benefit from reevaluation and getting updated supplies at a new machine. To that end, he is willing to consider coming in for sleep study. He is advised to stay compliant with CPAP therapy.  He has had some symptoms that are in keeping with carpal tunnel syndrome. He is advised to start using a over-the-counter wrist splint at night. We talked about the importance of healthy lifestyle and maintaining a good sleep schedule. He is advised to continue to pursue weight loss. I will see him back after sleep study testing is completed. I answered all his questions today and he was in agreement with the plan. Thank you very much for allowing me to participate in the care of this nice patient. If I can be of any further assistance to you please do not hesitate to call me at [phone].  Sincerely,   Star Age, MD, PhD

## 2019-01-25 NOTE — Patient Instructions (Signed)

## 2019-02-01 DIAGNOSIS — N401 Enlarged prostate with lower urinary tract symptoms: Secondary | ICD-10-CM | POA: Diagnosis not present

## 2019-02-01 DIAGNOSIS — N528 Other male erectile dysfunction: Secondary | ICD-10-CM | POA: Diagnosis not present

## 2019-02-01 DIAGNOSIS — R3915 Urgency of urination: Secondary | ICD-10-CM | POA: Diagnosis not present

## 2019-02-01 DIAGNOSIS — R3912 Poor urinary stream: Secondary | ICD-10-CM | POA: Diagnosis not present

## 2019-05-31 ENCOUNTER — Ambulatory Visit (INDEPENDENT_AMBULATORY_CARE_PROVIDER_SITE_OTHER): Payer: BC Managed Care – PPO | Admitting: Podiatry

## 2019-05-31 ENCOUNTER — Other Ambulatory Visit: Payer: Self-pay

## 2019-05-31 ENCOUNTER — Encounter: Payer: Self-pay | Admitting: Podiatry

## 2019-05-31 VITALS — Temp 98.1°F

## 2019-05-31 DIAGNOSIS — L600 Ingrowing nail: Secondary | ICD-10-CM | POA: Diagnosis not present

## 2019-05-31 MED ORDER — NEOMYCIN-POLYMYXIN-HC 3.5-10000-1 OT SOLN: OTIC | 0 refills | Status: DC

## 2019-05-31 MED ORDER — NEOMYCIN-POLYMYXIN-HC 3.5-10000-1 OT SOLN: OTIC | 1 refills | Status: DC

## 2019-05-31 NOTE — Patient Instructions (Signed)

## 2019-06-04 NOTE — Progress Notes (Signed)
Subjective:   Patient ID: [name] [name], male   DOB: 63 y.o.   MRN: [id number]   HPI Patient presents with chronically painful big toenails of both feet stating that he is tried to trim them and soak them without relief and that they are gradually becoming more of an issue for him.  Patient does not smoke likes to be active   Review of Systems  All other systems reviewed and are negative.       Objective:  Physical Exam Vitals signs and nursing note reviewed.  Constitutional:      Appearance: He is well-developed.  Pulmonary:     Effort: Pulmonary effort is normal.  Musculoskeletal: Normal range of motion.  Skin:    General: Skin is warm.  Neurological:     Mental Status: He is alert.     Neurovascular status was found to be intact muscle strength was found to be within normal limits with range of motion adequate.  Patient is found to have severe thickness of the big toenails bilateral that are loose and painful when palpated.  Patient was found to have good digital perfusion well oriented x3     Assessment:  Severely damaged hallux nails bilateral with significant symptomatology noted     Plan:  H&P conditions reviewed and recommended removal of the nails at this time.  I explained procedure risk and at this point I infiltrated each hallux after patient signed consent form I did sterile preps of each big toe and using sterile instrumentation remove the hallux nail exposed matrix and applied phenol 5 applications 30 seconds followed by alcohol lavage sterile dressing.  Gave instructions on soaks and reappoint and wrote for drops and instructed what to do if any throbbing were to occur and if it were to take dressing off right away if not leave on for 24 hours signed this

## 2019-07-05 DIAGNOSIS — L918 Other hypertrophic disorders of the skin: Secondary | ICD-10-CM | POA: Diagnosis not present

## 2019-07-05 DIAGNOSIS — L821 Other seborrheic keratosis: Secondary | ICD-10-CM | POA: Diagnosis not present

## 2019-07-05 DIAGNOSIS — L814 Other melanin hyperpigmentation: Secondary | ICD-10-CM | POA: Diagnosis not present

## 2019-07-05 DIAGNOSIS — D170 Benign lipomatous neoplasm of skin and subcutaneous tissue of head, face and neck: Secondary | ICD-10-CM | POA: Diagnosis not present

## 2019-10-12 DIAGNOSIS — Z23 Encounter for immunization: Secondary | ICD-10-CM | POA: Diagnosis not present

## 2019-12-03 DIAGNOSIS — R197 Diarrhea, unspecified: Secondary | ICD-10-CM | POA: Diagnosis not present

## 2019-12-03 DIAGNOSIS — E119 Type 2 diabetes mellitus without complications: Secondary | ICD-10-CM | POA: Diagnosis not present

## 2019-12-03 DIAGNOSIS — R05 Cough: Secondary | ICD-10-CM | POA: Diagnosis not present

## 2020-01-13 DIAGNOSIS — M25562 Pain in left knee: Secondary | ICD-10-CM | POA: Diagnosis not present

## 2020-01-14 ENCOUNTER — Telehealth: Payer: Self-pay | Admitting: Orthopaedic Surgery

## 2020-01-14 NOTE — Telephone Encounter (Signed)
I actually cannot order a MRI since I have not seen him for this current issue.  We would have to see him first before we order the MRI.

## 2020-01-14 NOTE — Telephone Encounter (Signed)
Patient called.   He sees Dr.Blackman but had to visit [facility] yesterday. He was told he possibly had a torn left meniscus and will need an MRI. He wants Dr.Blackman to order it.   Call back number:  [phone]

## 2020-01-14 NOTE — Telephone Encounter (Signed)
See below We haven't seen him in years

## 2020-01-15 ENCOUNTER — Telehealth: Payer: Self-pay | Admitting: Orthopaedic Surgery

## 2020-01-15 NOTE — Telephone Encounter (Signed)
Patient aware of the below message  

## 2020-01-15 NOTE — Telephone Encounter (Signed)
Called patient at 8:23 am and left a message informing him that he would need an appointment first.

## 2020-01-15 NOTE — Telephone Encounter (Signed)
Can we schedule him for an appointment please. [name] [name] said he needs to see him first

## 2020-01-15 NOTE — Telephone Encounter (Signed)
The only thing he can do is ice and elevation or even try a heating pad.  Also have him get his wife to pick up Voltaren Gel over the counter to massage into his knee.

## 2020-01-15 NOTE — Telephone Encounter (Signed)
Pt called back in made an appt with Dr.Balckman for 2/22 for torn left meniscus, pt said he can't walk and that is only his time his wife can bring him, also he can only get around by using crutches and he is wanting to know if Dr.Blackman has any recommendations for him on what to do from now until his appt on 2/22.  [phone]

## 2020-01-21 ENCOUNTER — Encounter: Payer: Self-pay | Admitting: Orthopaedic Surgery

## 2020-01-21 ENCOUNTER — Ambulatory Visit (INDEPENDENT_AMBULATORY_CARE_PROVIDER_SITE_OTHER): Payer: BC Managed Care – PPO | Admitting: Orthopaedic Surgery

## 2020-01-21 ENCOUNTER — Other Ambulatory Visit: Payer: Self-pay

## 2020-01-21 DIAGNOSIS — M25562 Pain in left knee: Secondary | ICD-10-CM | POA: Diagnosis not present

## 2020-01-21 MED ORDER — METHYLPREDNISOLONE ACETATE 40 MG/ML IJ SUSP
40.0000 mg | INTRAMUSCULAR | Status: AC | PRN
  Administered 2020-01-21: 16:00:00 40 mg via INTRA_ARTICULAR

## 2020-01-21 MED ORDER — LIDOCAINE HCL 1 % IJ SOLN
3.0000 mL | INTRAMUSCULAR | Status: AC | PRN
  Administered 2020-01-21: 16:00:00 3 mL

## 2020-01-21 NOTE — Progress Notes (Signed)
Office Visit Note   Patient: [name] [name]           Date of Birth: [birth year]           MRN: [id number] Visit Date: 01/21/2020              Requested by: Marton Redwood, MD [address] [place],  [place] [address] PCP: Marton Redwood, MD   Assessment & Plan: Visit Diagnoses:  1. Acute pain of left knee     Plan: I was able to aspirate about 40 cc of fluid off of his knee on the left side and then placed a steroid injection in the knee joint itself.  Given his mechanical symptoms a MRI is warranted at this point to rule out a meniscal tear as well as rule out any type of fracture.  He will continue his correction offload that knee as well.  We will see him back after the MRI of his left knee.  All question concerns were answered and addressed.  Follow-Up Instructions: Return in about 2 weeks (around 02/04/2020).   Orders:  Orders Placed This Encounter  Procedures  . Large Joint Inj   No orders of the defined types were placed in this encounter.     Procedures: Large Joint Inj: L knee on 01/21/2020 3:35 PM Indications: diagnostic evaluation and pain Details: 22 G 1.5 in needle, superolateral approach  Arthrogram: No  Medications: 3 mL lidocaine 1 %; 40 mg methylPREDNISolone acetate 40 MG/ML Outcome: tolerated well, no immediate complications Procedure, treatment alternatives, risks and benefits explained, specific risks discussed. Consent was given by the patient. Immediately prior to procedure a time out was called to verify the correct patient, procedure, equipment, support staff and site/side marked as required. Patient was prepped and draped in the usual sterile fashion.       Clinical Data: No additional findings.   Subjective: Chief Complaint  Patient presents with  . Left Knee - Pain  The patient comes in today with continued knee pain after an injury in just December of this past year.  It has been 2 months of still swelling with his left knee.  X-rays were  negative for any type of fracture.  He was seen in urgent care center and they put him on a steroid taper which did help some.  He still ambulate with a crutch and has been having locking and catching in his knee as well as sharp stabbing pains with that left knee.  We actually performed an arthroscopic intervention of his right knee years ago.  He is a very active 64 year old gentleman.  The plain films do not show any joint space narrowing.  HPI  Review of Systems He currently denies any headache, chest pain, shortness of breath, fever, chills, nausea, vomiting  Objective: Vital Signs: There were no vitals taken for this visit.  Physical Exam Is alert and orient x3 and in no acute distress Ortho Exam Examination of his left knee does show a moderate knee joint effusion.  He has significant medial joint line tenderness and a positive Murray sign to the medial compartment of his knee.  When I try to flex him past 90 degrees he gets severe left knee pain. Specialty Comments:  No specialty comments available.  Imaging: No results found.   PMFS History: Patient Active Problem List   Diagnosis Date Noted  . Abnormal nuclear stress test 04/25/2015  . Chest pain 04/04/2015  . Right knee meniscal tear 05/10/2014  . LBBB (  left bundle branch block)   . Obesity   . DM 10/16/2008  . Obstructive sleep apnea 10/16/2008  . ALLERGIC RHINITIS 10/16/2008  . Hyperlipidemia 10/15/2008  . Essential hypertension 10/15/2008   Past Medical History:  Diagnosis Date  . Arthritis    KNEES AND NECK; TORN MENISCUS RT KNEE  . Chest pain, atypical   . Diabetes mellitus    ORAL MEDS  . ED (erectile dysfunction)   . GERD (gastroesophageal reflux disease)   . H/O bee sting allergy   . Hyperlipidemia   . Hypertension   . LBBB (left bundle branch block)   . Lichen planus    OF MOUTH  . Obesity   . OSA (obstructive sleep apnea)    USES CPAP - DOES NOT KNOW SETTING  . Sleep apnea    wears CPAP      Family History  Problem Relation Age of Onset  . Hypertension Mother   . Heart disease Mother   . Rheum arthritis Mother   . Kidney failure Father   . Heart disease Father   . Kidney disease Father   . Heart disease Sister   . Colon cancer Neg Hx   . Esophageal cancer Neg Hx   . Stomach cancer Neg Hx   . Rectal cancer Neg Hx     Past Surgical History:  Procedure Laterality Date  . ANAL FISSURE REPAIR    . CARDIAC CATHETERIZATION N/A 04/25/2015   Procedure: Left Heart Cath and Coronary Angiography;  Surgeon: Peter M Martinique, MD;  Location: [place] [facility];  Service: Cardiovascular;  Laterality: N/A;  . CARDIOVASCULAR STRESS TEST  01/16/2010   EF 54%  . COLONOSCOPY    . KNEE ARTHROSCOPY Right 05/10/2014   Procedure: RIGHT KNEE ARTHROSCOPY WITH PARTIAL MEDIAL MENISCECTOMY;  Surgeon: Mcarthur Rossetti, MD;  Location: [facility];  Service: Orthopedics;  Laterality: Right;  . SINUS SURGERY WITH INSTATRAK    . US ECHOCARDIOGRAPHY  09/19/2007   EF 55-60%  . VASECTOMY     Social History   Occupational History  . Occupation: Press photographer Rep   Tobacco Use  . Smoking status: Never Smoker  . Smokeless tobacco: Never Used  Substance and Sexual Activity  . Alcohol use: No    Alcohol/week: 0.0 standard drinks  . Drug use: No  . Sexual activity: Not on file

## 2020-02-07 ENCOUNTER — Ambulatory Visit: Payer: BC Managed Care – PPO | Admitting: Orthopaedic Surgery

## 2020-02-10 ENCOUNTER — Ambulatory Visit
Admission: RE | Admit: 2020-02-10 | Discharge: 2020-02-10 | Disposition: A | Discharge status: Home / Self Care | Payer: Medicare Other | Source: Ambulatory Visit | Attending: Orthopaedic Surgery | Admitting: Orthopaedic Surgery

## 2020-02-10 ENCOUNTER — Other Ambulatory Visit: Payer: Self-pay

## 2020-02-10 DIAGNOSIS — M25562 Pain in left knee: Secondary | ICD-10-CM

## 2020-02-13 ENCOUNTER — Encounter: Payer: Self-pay | Admitting: Orthopaedic Surgery

## 2020-02-13 ENCOUNTER — Ambulatory Visit: Payer: Medicare Other | Admitting: Orthopaedic Surgery

## 2020-02-13 ENCOUNTER — Other Ambulatory Visit: Payer: Self-pay

## 2020-02-13 DIAGNOSIS — S83242D Other tear of medial meniscus, current injury, left knee, subsequent encounter: Secondary | ICD-10-CM

## 2020-02-13 DIAGNOSIS — M25562 Pain in left knee: Secondary | ICD-10-CM | POA: Diagnosis not present

## 2020-02-13 NOTE — Progress Notes (Signed)
The patient comes in for follow-up after having a MRI of his left knee.  This was to rule out a meniscal tear.  He has had a recent twisting injury from that knee and developed locking and catching.  At his last visit we aspirated 40 cc of fluid from the knee and placed a steroid injection in the knee.  He states this did help decrease some of his pain but he still has a lot of medial joint line tenderness and he reports as well as pain with pivoting his knee.  There is still some locking and catching.  We actually performed an arthroscopic surgery on his right knee in 2015 for a medial meniscal tear.  On examination of his left knee today, his effusion is much less and the knee is moving better but there is significant medial joint line tenderness still and still a positive McMurray's exam to the medial compartment of the left knee.  The MRI of his left knee is reviewed with him.  It does show a large and complex acute tear of the medial meniscus of his left knee with a flap component.  The medial and lateral compartments have preserved cartilage with no cartilage defect at all.  There is a mild joint effusion.  The ACL and PCL as well as collateral ligaments are intact.  The lateral meniscus is normal.  Given his mechanical symptoms and the large flap component with a complex meniscal tear of the left knee that is acute, we are recommending an arthroscopic intervention for the left knee.  All risk and benefits of surgery were explained in detail and he does wish to proceed with this once he determines what his Medicare benefits are.  We will work on getting this scheduled and be in touch with him.  Having had the surgery before he is fully aware of what it involves.  I agree with this being a recommendation for surgery based on his clinical exam as well as MRI findings and the very conservative treatment.

## 2020-02-21 ENCOUNTER — Telehealth: Payer: Self-pay | Admitting: Orthopaedic Surgery

## 2020-02-21 NOTE — Telephone Encounter (Signed)
Pt called in stating he had an appt on 02/13/20 with Dr. Ninfa Linden and they discussed surgery but no one has called him. Pt would like a call back to explain whats going on?  [phone]

## 2020-02-22 NOTE — Telephone Encounter (Signed)
I called patient and left voice mail for return call. °

## 2020-02-26 NOTE — Telephone Encounter (Signed)
Spoke with patient and scheduled.

## 2020-03-13 ENCOUNTER — Other Ambulatory Visit: Payer: Self-pay | Admitting: Orthopaedic Surgery

## 2020-03-13 DIAGNOSIS — S83232D Complex tear of medial meniscus, current injury, left knee, subsequent encounter: Secondary | ICD-10-CM | POA: Diagnosis not present

## 2020-03-13 MED ORDER — HYDROCODONE-ACETAMINOPHEN 5-325 MG PO TABS: 1.0000 | ORAL_TABLET | Freq: Four times a day (QID) | ORAL | 0 refills | Status: DC | PRN

## 2020-03-19 ENCOUNTER — Other Ambulatory Visit: Payer: Self-pay

## 2020-03-19 ENCOUNTER — Encounter: Payer: Self-pay | Admitting: Orthopaedic Surgery

## 2020-03-19 ENCOUNTER — Ambulatory Visit (INDEPENDENT_AMBULATORY_CARE_PROVIDER_SITE_OTHER): Payer: Medicare Other | Admitting: Orthopaedic Surgery

## 2020-03-19 DIAGNOSIS — S83242D Other tear of medial meniscus, current injury, left knee, subsequent encounter: Secondary | ICD-10-CM

## 2020-03-19 DIAGNOSIS — Z9889 Other specified postprocedural states: Secondary | ICD-10-CM

## 2020-03-19 MED ORDER — MELOXICAM 15 MG PO TABS: 15.0000 mg | ORAL_TABLET | Freq: Every day | ORAL | 3 refills | Status: DC | PRN

## 2020-03-19 NOTE — Progress Notes (Signed)
Patient is a very pleasant 64 year old gentleman who is a week out from a left knee arthroscopy with a partial medial meniscectomy.  He is moderately obese and had really good cartilage in all 3 compartments of his left knee.  There was a posterior horn and root medial meniscal tear that was acute.  His ACL and PCL as well as lateral meniscus were all intact.  He had no significant cartilage wear in the knee at all.  He reports that he is doing very well since surgery.  On examination of his left knee there is a moderate effusion but is not uncomfortable to him.  I remove the sutures from his knee.  His calf is soft.  At this point he will work on increasing his activities as comfort allows.  He can get back to his gardening if he is comfortable doing so.  We will send in some meloxicam as an anti-inflammatory.  I would like to see him back in just 2 weeks because if he still has a moderate effusion we would recommend potentially an aspiration of steroid injection just to calm down the inflammation from surgery.  All questions and concerns were answered and addressed.  I did go over his arthroscopy pictures with him as well.

## 2020-04-02 ENCOUNTER — Other Ambulatory Visit: Payer: Self-pay

## 2020-04-02 ENCOUNTER — Ambulatory Visit (INDEPENDENT_AMBULATORY_CARE_PROVIDER_SITE_OTHER): Payer: Medicare Other | Admitting: Orthopaedic Surgery

## 2020-04-02 ENCOUNTER — Encounter: Payer: Self-pay | Admitting: Orthopaedic Surgery

## 2020-04-02 DIAGNOSIS — S83242D Other tear of medial meniscus, current injury, left knee, subsequent encounter: Secondary | ICD-10-CM

## 2020-04-02 DIAGNOSIS — Z9889 Other specified postprocedural states: Secondary | ICD-10-CM

## 2020-04-02 MED ORDER — LIDOCAINE HCL 1 % IJ SOLN
3.0000 mL | INTRAMUSCULAR | Status: AC | PRN
  Administered 2020-04-02: 17:00:00 3 mL

## 2020-04-02 MED ORDER — METHYLPREDNISOLONE ACETATE 40 MG/ML IJ SUSP
40.0000 mg | INTRAMUSCULAR | Status: AC | PRN
  Administered 2020-04-02: 40 mg via INTRA_ARTICULAR

## 2020-04-02 NOTE — Progress Notes (Signed)
   Procedure Note  Patient: [name] [name]             Date of Birth: 1956-05-16           MRN: [id number]             Visit Date: 04/02/2020  Procedures: Visit Diagnoses:  1. Acute medial meniscal tear, left, subsequent encounter   2. Status post arthroscopy of left knee     Large Joint Inj: L knee on 04/02/2020 4:38 PM Indications: diagnostic evaluation and pain Details: 22 G 1.5 in needle, superolateral approach  Arthrogram: No  Medications: 3 mL lidocaine 1 %; 40 mg methylPREDNISolone acetate 40 MG/ML Outcome: tolerated well, no immediate complications Procedure, treatment alternatives, risks and benefits explained, specific risks discussed. Consent was given by the patient. Immediately prior to procedure a time out was called to verify the correct patient, procedure, equipment, support staff and site/side marked as required. Patient was prepped and draped in the usual sterile fashion.    The patient is just over 3 weeks out from a left knee arthroscopy.  He is a very active 64 year old gentleman we did find a significant medial meniscal tear with some thinning of the cartilage in the knee.  He did have a significant effusion on his last visit only on the femur today to see if that is the side of any to determine whether or not he would benefit from an aspiration and steroid injection.  He does feel like the knee is still tight.  On examination of his left knee there is still a mild to moderate effusion.  It is not as large as it was at last visit.  After I cleaned the knee with alcohol and placed lidocaine into the knee joint, I was able to aspirate about 30 cc of fluid off the knee.  This gave him good relief and I placed a steroid injection into his knee.  At this point he may end up being a candidate eventually for hyaluronic acid into his left knee.  I would like to see him back in 4 weeks to see how he is doing overall in general.  All questions and concerns were answered and  addressed.

## 2020-04-30 ENCOUNTER — Other Ambulatory Visit: Payer: Self-pay

## 2020-04-30 ENCOUNTER — Ambulatory Visit (INDEPENDENT_AMBULATORY_CARE_PROVIDER_SITE_OTHER): Payer: Medicare Other | Admitting: Orthopaedic Surgery

## 2020-04-30 ENCOUNTER — Encounter: Payer: Self-pay | Admitting: Orthopaedic Surgery

## 2020-04-30 DIAGNOSIS — Z9889 Other specified postprocedural states: Secondary | ICD-10-CM

## 2020-04-30 NOTE — Progress Notes (Signed)
HPI: Mr. [name] [name] returns today 7 weeks status post left knee arthroscopy.  Again he had a significant medial meniscal tear with some thinning of the cartilage.  He was seen at 3 weeks postop and at that time the knee was aspirated and cortisone injection was given.  He states that the aspiration injection definitely helped with the knee pain.  He still has some discomfort whenever he is up on the knee for prolonged period of time.  He still having some swelling.  Overall he does feel like he is trending towards improvement though.  Physical exam: Left knee port sites well-healed.  Slight effusion no abnormal warmth erythema.  Good range of motion of the knee.  Impression: 7-week status post left knee arthroscopy  Plan: He will work on Forensic scientist.  He may benefit from supplemental injection in the left knee in the future.  Questions were encouraged and answered at length.

## 2020-05-28 ENCOUNTER — Ambulatory Visit: Payer: Medicare Other | Admitting: Orthopaedic Surgery

## 2020-06-11 ENCOUNTER — Other Ambulatory Visit: Payer: Self-pay

## 2020-06-11 ENCOUNTER — Ambulatory Visit (INDEPENDENT_AMBULATORY_CARE_PROVIDER_SITE_OTHER): Payer: Medicare Other | Admitting: Orthopaedic Surgery

## 2020-06-11 ENCOUNTER — Encounter: Payer: Self-pay | Admitting: Orthopaedic Surgery

## 2020-06-11 DIAGNOSIS — S83242D Other tear of medial meniscus, current injury, left knee, subsequent encounter: Secondary | ICD-10-CM | POA: Diagnosis not present

## 2020-06-11 DIAGNOSIS — Z9889 Other specified postprocedural states: Secondary | ICD-10-CM

## 2020-06-11 MED ORDER — LIDOCAINE HCL 1 % IJ SOLN
3.0000 mL | INTRAMUSCULAR | Status: AC | PRN
  Administered 2020-06-11: 3 mL

## 2020-06-11 MED ORDER — METHYLPREDNISOLONE ACETATE 40 MG/ML IJ SUSP
40.0000 mg | INTRAMUSCULAR | Status: AC | PRN
  Administered 2020-06-11: 40 mg via INTRA_ARTICULAR

## 2020-06-11 NOTE — Progress Notes (Signed)
Office Visit Note   Patient: [name] [name]           Date of Birth: 1956/06/03           MRN: [id number] Visit Date: 06/11/2020              Requested by: Marton Redwood, MD [address] [place],  [place] [address] PCP: Marton Redwood, MD   Assessment & Plan: Visit Diagnoses:  1. Status post arthroscopy of left knee   2. Acute medial meniscal tear, left, subsequent encounter     Plan: I was able to aspirate between 40 and 50 cc of fluid from his knee consistent with a postoperative state.  I placed a steroid injection in the knee as well.  He is doing well overall.  We can see him back in 3 months to make sure things are still improving for him.  If there is any issues before then he will let us know.  Follow-Up Instructions: Return in about 3 months (around 09/11/2020).   Orders:  Orders Placed This Encounter  Procedures  . Large Joint Inj: L knee   No orders of the defined types were placed in this encounter.     Procedures: Large Joint Inj: L knee on 06/11/2020 4:41 PM Indications: diagnostic evaluation and pain Details: 22 G 1.5 in needle, superolateral approach  Arthrogram: No  Medications: 3 mL lidocaine 1 %; 40 mg methylPREDNISolone acetate 40 MG/ML Outcome: tolerated well, no immediate complications Procedure, treatment alternatives, risks and benefits explained, specific risks discussed. Consent was given by the patient. Immediately prior to procedure a time out was called to verify the correct patient, procedure, equipment, support staff and site/side marked as required. Patient was prepped and draped in the usual sterile fashion.       Clinical Data: No additional findings.   Subjective: Chief Complaint  Patient presents with  . Left Knee - Routine Post Op  The patient is now about 13 weeks status post a left knee arthroscopy with a partial meniscectomy.  He is doing well overall and reports improved motion and improve strength with his left knee with  not a lot of pain.  HPI  Review of Systems He currently denies any headache, chest pain, shortness of breath, fever, chills, nausea, vomiting  Objective: Vital Signs: There were no vitals taken for this visit.  Physical Exam He is alert and orient x3 and in no acute distress Ortho Exam Examination of his left knee shows a moderate knee effusion.  The knee is otherwise stable with good range of motion.  It is slightly warm but not red. Specialty Comments:  No specialty comments available.  Imaging: No results found.   PMFS History: Patient Active Problem List   Diagnosis Date Noted  . Abnormal nuclear stress test 04/25/2015  . Chest pain 04/04/2015  . Right knee meniscal tear 05/10/2014  . LBBB (left bundle branch block)   . Obesity   . DM 10/16/2008  . Obstructive sleep apnea 10/16/2008  . ALLERGIC RHINITIS 10/16/2008  . Hyperlipidemia 10/15/2008  . Essential hypertension 10/15/2008   Past Medical History:  Diagnosis Date  . Arthritis    KNEES AND NECK; TORN MENISCUS RT KNEE  . Chest pain, atypical   . Diabetes mellitus    ORAL MEDS  . ED (erectile dysfunction)   . GERD (gastroesophageal reflux disease)   . H/O bee sting allergy   . Hyperlipidemia   . Hypertension   . LBBB (left bundle branch  block)   . Lichen planus    OF MOUTH  . Obesity   . OSA (obstructive sleep apnea)    USES CPAP - DOES NOT KNOW SETTING  . Sleep apnea    wears CPAP    Family History  Problem Relation Age of Onset  . Hypertension Mother   . Heart disease Mother   . Rheum arthritis Mother   . Kidney failure Father   . Heart disease Father   . Kidney disease Father   . Heart disease Sister   . Colon cancer Neg Hx   . Esophageal cancer Neg Hx   . Stomach cancer Neg Hx   . Rectal cancer Neg Hx     Past Surgical History:  Procedure Laterality Date  . ANAL FISSURE REPAIR    . CARDIAC CATHETERIZATION N/A 04/25/2015   Procedure: Left Heart Cath and Coronary Angiography;  Surgeon:  Peter M Martinique, MD;  Location: [facility];  Service: Cardiovascular;  Laterality: N/A;  . CARDIOVASCULAR STRESS TEST  01/16/2010   EF 54%  . COLONOSCOPY    . KNEE ARTHROSCOPY Right 05/10/2014   Procedure: RIGHT KNEE ARTHROSCOPY WITH PARTIAL MEDIAL MENISCECTOMY;  Surgeon: Mcarthur Rossetti, MD;  Location: [facility];  Service: Orthopedics;  Laterality: Right;  . SINUS SURGERY WITH INSTATRAK    . US ECHOCARDIOGRAPHY  09/19/2007   EF 55-60%  . VASECTOMY     Social History   Occupational History  . Occupation: Press photographer Rep   Tobacco Use  . Smoking status: Never Smoker  . Smokeless tobacco: Never Used  Substance and Sexual Activity  . Alcohol use: No    Alcohol/week: 0.0 standard drinks  . Drug use: No  . Sexual activity: Not on file

## 2020-09-11 ENCOUNTER — Ambulatory Visit: Payer: Medicare Other | Admitting: Orthopaedic Surgery

## 2020-10-01 ENCOUNTER — Ambulatory Visit: Payer: Medicare Other | Admitting: Physician Assistant

## 2020-10-01 ENCOUNTER — Ambulatory Visit: Payer: Self-pay

## 2020-10-01 ENCOUNTER — Encounter: Payer: Self-pay | Admitting: Physician Assistant

## 2020-10-01 VITALS — Ht 72.0 in | Wt 302.2 lb

## 2020-10-01 DIAGNOSIS — M5442 Lumbago with sciatica, left side: Secondary | ICD-10-CM | POA: Diagnosis not present

## 2020-10-01 MED ORDER — METHYLPREDNISOLONE 4 MG PO TABS
ORAL_TABLET | ORAL | 0 refills | Status: DC
Start: 2020-10-01 — End: 2020-11-18

## 2020-10-01 NOTE — Progress Notes (Signed)
Office Visit Note   Patient: [name] [name]           Date of Birth: 02-Feb-1956           MRN: [id number] Visit Date: 10/01/2020              Requested by: Marton Redwood, MD [address] [place],   [id number] PCP: Marton Redwood, MD   Assessment & Plan: Visit Diagnoses:  1. Left-sided low back pain with left-sided sciatica, unspecified chronicity     Plan: Patient is given a prescription for physical therapy: Spine is to include modalities, core strengthening home exercise program and back exercises.  Also placed him on a Medrol Dosepak and he will continue his Flexeril.  He is to discontinue his diclofenac until he is finished with the Dosepak once he is finished with the Dosepak he can go back on the diclofenac.  We will see him back in 1 month however if he has no relief of pain within the next 2 weeks need to call our office and order an MRI at that time to rule out HNP as a source of his radicular pain in the left leg.  Questions were encouraged and answered at length.  Follow-Up Instructions: No follow-ups on file.   Orders:  Orders Placed This Encounter  Procedures  . XR Lumbar Spine 2-3 Views   Meds ordered this encounter  Medications  . methylPREDNISolone (MEDROL) 4 MG tablet    Sig: Take as directed    Dispense:  21 tablet    Refill:  0      Procedures: No procedures performed   Clinical Data: No additional findings.   Subjective: Chief Complaint  Patient presents with  . Lower Back - Pain    HPI Mr. Bobier is a 64 year old male well-known to Dr. Ninfa Linden service comes in today with low back pain with radicular symptoms down the left leg since last Wednesday.  He reports he was pulling a root and felt pain in his back.  Another occasion he was picking up a box of he felt pain in his back.  He is now having pain in his left leg radiates into his hip thigh occasionally down the leg to above the ankle.  Pain is described as sharp intense pain.  He is  having to use a cane to ambulate.  He denies any numbness tingling down the leg.  Denies any waking pain, saddle anesthesia like symptoms, or bowel bladder dysfunction.  Has been taking diclofenac and Flexeril but is not helping.  Said no prior surgery on his back.  Has had occasional low back pain in the past that usually last about 2 to 3 days. Review of Systems See HPI  Objective: Vital Signs: Ht 6' (1.829 m)   Wt (!) 302 lb 3.2 oz (137.1 kg)   BMI 40.99 kg/m   Physical Exam Constitutional:      Appearance: He is not ill-appearing or diaphoretic.  Cardiovascular:     Pulses: Normal pulses.  Pulmonary:     Effort: Pulmonary effort is normal.  Neurological:     Mental Status: He is alert and oriented to person, place, and time.  Psychiatric:        Mood and Affect: Mood normal.     Ortho Exam 5 5 strength throughout lower extremities against resistance tight hamstrings bilaterally.  Negative straight leg raise bilaterally.  Flexion of the lumbar spine causes no discomfort comes with about 6 inches of touching his  toes.  Extension lumbar spine causes pain in the low back that radiates into the left thigh.  Full sensation bilateral feet.  Deep tendon reflexes are 2+ at the knees and ankles and equal and symmetric. Specialty Comments:  No specialty comments available.  Imaging: XR Lumbar Spine 2-3 Views  Result Date: 10/01/2020 Lumbar spine 2 views: No acute fractures.  Some minimal degenerative changes with endplate spurring upper lumbar bodies and facet changes of the lower lumbar region.  No spondylolisthesis.  Normal lordotic curvature.    PMFS History: Patient Active Problem List   Diagnosis Date Noted  . Abnormal nuclear stress test 04/25/2015  . Chest pain 04/04/2015  . Right knee meniscal tear 05/10/2014  . LBBB (left bundle branch block)   . Obesity   . DM 10/16/2008  . Obstructive sleep apnea 10/16/2008  . ALLERGIC RHINITIS 10/16/2008  . Hyperlipidemia  10/15/2008  . Essential hypertension 10/15/2008   Past Medical History:  Diagnosis Date  . Arthritis    KNEES AND NECK; TORN MENISCUS RT KNEE  . Chest pain, atypical   . Diabetes mellitus    ORAL MEDS  . ED (erectile dysfunction)   . GERD (gastroesophageal reflux disease)   . H/O bee sting allergy   . Hyperlipidemia   . Hypertension   . LBBB (left bundle branch block)   . Lichen planus    OF MOUTH  . Obesity   . OSA (obstructive sleep apnea)    USES CPAP - DOES NOT KNOW SETTING  . Sleep apnea    wears CPAP    Family History  Problem Relation Age of Onset  . Hypertension Mother   . Heart disease Mother   . Rheum arthritis Mother   . Kidney failure Father   . Heart disease Father   . Kidney disease Father   . Heart disease Sister   . Colon cancer Neg Hx   . Esophageal cancer Neg Hx   . Stomach cancer Neg Hx   . Rectal cancer Neg Hx     Past Surgical History:  Procedure Laterality Date  . ANAL FISSURE REPAIR    . CARDIAC CATHETERIZATION N/A 04/25/2015   Procedure: Left Heart Cath and Coronary Angiography;  Surgeon: Peter M Martinique, MD;  Location: [facility];  Service: Cardiovascular;  Laterality: N/A;  . CARDIOVASCULAR STRESS TEST  01/16/2010   EF 54%  . COLONOSCOPY    . KNEE ARTHROSCOPY Right 05/10/2014   Procedure: RIGHT KNEE ARTHROSCOPY WITH PARTIAL MEDIAL MENISCECTOMY;  Surgeon: Mcarthur Rossetti, MD;  Location: [facility];  Service: Orthopedics;  Laterality: Right;  . SINUS SURGERY WITH INSTATRAK    . US ECHOCARDIOGRAPHY  09/19/2007   EF 55-60%  . VASECTOMY     Social History   Occupational History  . Occupation: Press photographer Rep   Tobacco Use  . Smoking status: Never Smoker  . Smokeless tobacco: Never Used  Substance and Sexual Activity  . Alcohol use: No    Alcohol/week: 0.0 standard drinks  . Drug use: No  . Sexual activity: Not on file

## 2020-10-29 ENCOUNTER — Ambulatory Visit: Payer: Medicare Other | Admitting: Physician Assistant

## 2020-11-14 ENCOUNTER — Other Ambulatory Visit: Payer: Self-pay

## 2020-11-14 ENCOUNTER — Encounter (HOSPITAL_COMMUNITY): Payer: Self-pay | Admitting: Emergency Medicine

## 2020-11-14 ENCOUNTER — Emergency Department (HOSPITAL_COMMUNITY)
Admission: EM | Admit: 2020-11-14 | Discharge: 2020-11-15 | Disposition: A | Discharge status: Home / Self Care | Payer: Medicare Other | Attending: Emergency Medicine | Admitting: Emergency Medicine

## 2020-11-14 DIAGNOSIS — I10 Essential (primary) hypertension: Secondary | ICD-10-CM | POA: Diagnosis not present

## 2020-11-14 DIAGNOSIS — R519 Headache, unspecified: Secondary | ICD-10-CM | POA: Insufficient documentation

## 2020-11-14 DIAGNOSIS — Z5321 Procedure and treatment not carried out due to patient leaving prior to being seen by health care provider: Secondary | ICD-10-CM | POA: Insufficient documentation

## 2020-11-14 NOTE — ED Triage Notes (Signed)
Pt presents to ED BIB [facility]. Pt c/o HA and HTN. Pt noncompliant w/ medication. Took bp medication tonight and since pain has been improving.  EMS VS -  174/96

## 2020-11-14 NOTE — ED Notes (Signed)
Pt says he is leaving and walked out.

## 2020-11-15 ENCOUNTER — Ambulatory Visit
Admission: EM | Admit: 2020-11-15 | Discharge: 2020-11-15 | Disposition: A | Discharge status: Home / Self Care | Payer: Medicare Other | Attending: Urgent Care | Admitting: Urgent Care

## 2020-11-15 ENCOUNTER — Other Ambulatory Visit: Payer: Self-pay

## 2020-11-15 DIAGNOSIS — I447 Left bundle-branch block, unspecified: Secondary | ICD-10-CM

## 2020-11-15 DIAGNOSIS — R61 Generalized hyperhidrosis: Secondary | ICD-10-CM

## 2020-11-15 DIAGNOSIS — I10 Essential (primary) hypertension: Secondary | ICD-10-CM

## 2020-11-15 DIAGNOSIS — R110 Nausea: Secondary | ICD-10-CM

## 2020-11-15 DIAGNOSIS — G44209 Tension-type headache, unspecified, not intractable: Secondary | ICD-10-CM

## 2020-11-15 NOTE — Discharge Instructions (Signed)
Please just use Tylenol at a dose of 500mg-650mg once every 6 hours as needed for your aches, pains, fevers. Do not use any nonsteroidal anti-inflammatories (NSAIDs) like ibuprofen, Motrin, naproxen, Aleve, etc. which are all available over-the-counter.   

## 2020-11-15 NOTE — ED Triage Notes (Signed)
Patient states he has had a headache x 3 days. Patient also complains of hypertensive episodes over the last 2 days. Patient states he was seen last night in the ED at [facility] and had an ekg but left prior to any other treatments. Pt is aox4 and ambulatory.

## 2020-11-15 NOTE — ED Provider Notes (Signed)
[facility]   MRN: [id number] DOB: 11-30-1955  Subjective:   [name] [name] is a 64 y.o. male presenting for follow-up on persistent headaches.  Symptoms started 3 days ago.  He actually did have an episode of diaphoresis together with his headache initially with some nausea and but no vomiting.  Was having moderate to severe intermittent headaches over the posterior aspect of his head to the top of his head.  Patient states that he became concerned and went to the fire department where he had his blood pressure checked and was in the 665L over 935 systolic.  They subsequently recommended he be evaluated in the emergency room, was transported by ambulance.  In the emergency room, he had an EKG done and left shortly thereafter.  Patient did not have any labs drawn before he left.  He presents today for checkup as he still has concerns regarding his headaches.  Has a family history of MI in his brother and mother.  They both had neck and jaw pain with their symptoms.  His brother recently passed away at 78 years old from the same.  Patient has had a heart catheterization in 2016, was deemed normal.  He has a history of a left bundle branch block but has not followed up with a cardiologist consistently.  Has a history of OSA, essential hypertension, obesity, hyperlipidemia and diabetes.  His diabetes is well controlled.  Denies having had chest pain, weakness, vision change, numbness or tingling, neck or jaw pain, left arm pain, belly pain.  Patient is not a smoker.  No current facility-administered medications for this encounter.  Current Outpatient Medications:  .  amLODipine (NORVASC) 10 MG tablet, Take 10 mg by mouth daily., Disp: , Rfl:  .  aspirin 81 MG tablet, Take 81 mg by mouth daily., Disp: , Rfl:  .  Multiple Vitamin (MULTIVITAMIN) tablet, Take 1 tablet by mouth daily., Disp: , Rfl:  .  neomycin-polymyxin-hydrocortisone (CORTISPORIN) OTIC solution, Apply 1-2 drops to toe after  soaking BID, Disp: 10 mL, Rfl: 1 .  omeprazole (PRILOSEC) 20 MG capsule, Take 20 mg by mouth daily., Disp: , Rfl:  .  rosuvastatin (CRESTOR) 10 MG tablet, Take 5 mg by mouth daily., Disp: , Rfl:  .  sertraline (ZOLOFT) 25 MG tablet, Take 25 mg by mouth every morning., Disp: , Rfl:  .  sildenafil (REVATIO) 20 MG tablet, Take 20 mg by mouth as needed (erectile dysfunction). , Disp: , Rfl:  .  telmisartan-hydrochlorothiazide (MICARDIS HCT) 80-25 MG tablet, Take 1 tablet by mouth daily., Disp: , Rfl:  .  traMADol (ULTRAM) 50 MG tablet, Take 50 mg by mouth every 6 (six) hours as needed for moderate pain. , Disp: , Rfl:  .  diclofenac (VOLTAREN) 75 MG EC tablet, Take 1 tablet (75 mg total) by mouth 2 (two) times daily., Disp: 50 tablet, Rfl: 2 .  Dulaglutide 1.5 MG/0.5ML SOPN, Inject 1.5 mg into the skin once a week. sunday, Disp: , Rfl:  .  EPINEPHrine (EPIPEN JR) 0.15 MG/0.3ML injection, Inject 0.15 mg into the muscle as needed for anaphylaxis (bee venom)., Disp: , Rfl:  .  methylPREDNISolone (MEDROL) 4 MG tablet, Take as directed, Disp: 21 tablet, Rfl: 0   Allergies  Allergen Reactions  . Bee Venom Anaphylaxis  . Penicillins Hives and Rash    Past Medical History:  Diagnosis Date  . Arthritis    KNEES AND NECK; TORN MENISCUS RT KNEE  . Chest pain, atypical   . Diabetes  mellitus    ORAL MEDS  . ED (erectile dysfunction)   . GERD (gastroesophageal reflux disease)   . H/O bee sting allergy   . Hyperlipidemia   . Hypertension   . LBBB (left bundle branch block)   . Lichen planus    OF MOUTH  . Obesity   . OSA (obstructive sleep apnea)    USES CPAP - DOES NOT KNOW SETTING  . Sleep apnea    wears CPAP     Past Surgical History:  Procedure Laterality Date  . ANAL FISSURE REPAIR    . CARDIAC CATHETERIZATION N/A 04/25/2015   Procedure: Left Heart Cath and Coronary Angiography;  Surgeon: Peter M Martinique, MD;  Location: [facility];  Service: Cardiovascular;  Laterality: N/A;  .  CARDIOVASCULAR STRESS TEST  01/16/2010   EF 54%  . COLONOSCOPY    . KNEE ARTHROSCOPY Right 05/10/2014   Procedure: RIGHT KNEE ARTHROSCOPY WITH PARTIAL MEDIAL MENISCECTOMY;  Surgeon: Mcarthur Rossetti, MD;  Location: [facility];  Service: Orthopedics;  Laterality: Right;  . SINUS SURGERY WITH INSTATRAK    . US ECHOCARDIOGRAPHY  09/19/2007   EF 55-60%  . VASECTOMY      Family History  Problem Relation Age of Onset  . Hypertension Mother   . Heart disease Mother   . Rheum arthritis Mother   . Kidney failure Father   . Heart disease Father   . Kidney disease Father   . Heart disease Sister   . Colon cancer Neg Hx   . Esophageal cancer Neg Hx   . Stomach cancer Neg Hx   . Rectal cancer Neg Hx     Social History   Tobacco Use  . Smoking status: Never Smoker  . Smokeless tobacco: Never Used  Vaping Use  . Vaping Use: Never used  Substance Use Topics  . Alcohol use: No    Alcohol/week: 0.0 standard drinks  . Drug use: No    ROS   Objective:   Vitals: BP 124/74 (BP Location: Right Arm)   Temp 98.1 F (36.7 C) (Oral)   Resp 18   SpO2 98%   Physical Exam Constitutional:      General: He is not in acute distress.    Appearance: Normal appearance. He is well-developed. He is obese. He is not ill-appearing, toxic-appearing or diaphoretic.  HENT:     Head: Normocephalic and atraumatic.     Right Ear: External ear normal.     Left Ear: External ear normal.     Nose: Nose normal.     Mouth/Throat:     Mouth: Mucous membranes are moist.     Pharynx: Oropharynx is clear.  Eyes:     General: No scleral icterus.    Extraocular Movements: Extraocular movements intact.     Pupils: Pupils are equal, round, and reactive to light.  Cardiovascular:     Rate and Rhythm: Normal rate and regular rhythm.     Heart sounds: Normal heart sounds. No murmur heard. No friction rub. No gallop.   Pulmonary:     Effort: Pulmonary effort is normal. No respiratory distress.     Breath  sounds: Normal breath sounds. No stridor. No wheezing, rhonchi or rales.  Neurological:     Mental Status: He is alert and oriented to person, place, and time.     Cranial Nerves: No cranial nerve deficit.     Motor: No weakness.     Coordination: Romberg sign negative. Coordination normal.  Gait: Gait normal.  Psychiatric:        Mood and Affect: Mood normal.        Behavior: Behavior normal.        Thought Content: Thought content normal.     ED ECG REPORT   Date: 11/15/2020  EKG Time: 10:42 AM  Rate: 94bpm  Rhythm: normal sinus rhythm,  normal EKG, normal sinus rhythm, nonspecific ST and T waves changes, LBBB  Axis: left  Intervals:left bundle branch block  ST&T Change: T-wave inversion in I, aVL  Narrative Interpretation: sinus rhythm at 94bpm with non-specific changes in t-waves as above, different from yesterday, 2015, 2016.    Assessment and Plan :   PDMP not reviewed this encounter.  1. Diaphoresis   2. Nausea without vomiting   3. Acute non intractable tension-type headache   4. Essential hypertension   5. Left bundle branch block     Had extensive discussion with patient about the differential which includes ACS, stroke.  Discussed the possibility that during his hypertensive episodes he can have generalized headaches.  However, it is very concerning to me that his EKG has changed each time its been done both from 20 15-20 16, yesterday and today's.  The left bundle branch has been consistent.  I reviewed this with patient and he is not inclined to go to the emergency room due to wait times.  Emphasized need to follow-up with a cardiologist soon as possible.  Recommendations provided to the patient, referral is pending.  I encouraged patient and emphasized need to be compliant with his medications and have very close follow-up with his PCP.  Maintain strict ER precautions.  Physical exam findings and vital signs stable currently for outpatient management.  Counseled  patient on potential for adverse effects with medications prescribed today, patient verbalized understanding.    Jaynee Eagles, PA-C 11/15/20 1213

## 2020-11-18 ENCOUNTER — Ambulatory Visit: Payer: Medicare Other | Admitting: Cardiology

## 2020-11-18 ENCOUNTER — Other Ambulatory Visit: Payer: Self-pay

## 2020-11-18 ENCOUNTER — Encounter: Payer: Self-pay | Admitting: Cardiology

## 2020-11-18 VITALS — BP 126/81 | HR 92 | Ht 73.0 in | Wt 298.0 lb

## 2020-11-18 DIAGNOSIS — G4733 Obstructive sleep apnea (adult) (pediatric): Secondary | ICD-10-CM

## 2020-11-18 DIAGNOSIS — E7800 Pure hypercholesterolemia, unspecified: Secondary | ICD-10-CM

## 2020-11-18 DIAGNOSIS — I10 Essential (primary) hypertension: Secondary | ICD-10-CM

## 2020-11-18 DIAGNOSIS — I447 Left bundle-branch block, unspecified: Secondary | ICD-10-CM

## 2020-11-18 DIAGNOSIS — R0609 Other forms of dyspnea: Secondary | ICD-10-CM

## 2020-11-18 DIAGNOSIS — R0600 Dyspnea, unspecified: Secondary | ICD-10-CM

## 2020-11-18 DIAGNOSIS — E119 Type 2 diabetes mellitus without complications: Secondary | ICD-10-CM

## 2020-11-18 DIAGNOSIS — I5032 Chronic diastolic (congestive) heart failure: Secondary | ICD-10-CM

## 2020-11-18 MED ORDER — METOPROLOL TARTRATE 50 MG PO TABS
50.0000 mg | ORAL_TABLET | Freq: Two times a day (BID) | ORAL | 2 refills | Status: DC
Start: 2020-11-18 — End: 2021-01-09

## 2020-11-18 NOTE — Progress Notes (Signed)
Primary Physician/Referring:  Marton Redwood, MD  Patient ID: [name] [name], male    DOB: [birth year], 64 y.o.   MRN: [id number]  Chief Complaint  Patient presents with  . Left Bundle Branch Block  . Coronary Artery Disease   HPI:    [name] [name]  is a 64 y.o. Caucasian male with hypertension, hyperlipidemia, diabetes mellitus, obstructive sleep apnea on CPAP, left bundle branch block is now referred on a stat basis by [facility], AG-NP for evaluation of abnormal EKG, fatigue, headache and left upper extremity tingling and numbness associated with nausea and improved on nitroglycerin.  Patient has been evaluated in the emergency room and also with EMS, due to long wait in the ED, walked out AMA 2 days ago on 11/15/2020.  Patient is also being evaluated in urgent care with no significant changes in the EKG. On further questioning, he has been having shortness of breath and had discontinued taking all his medicines about 2 weeks ago except diabetic medications. Since then he started noticing weight gain, not feeling well, fatigue and tiredness.  He has chronic neck pain and also degenerative joint disease involving bilateral knee. He has noticed occasional tingling and numbness in his left forearm. No relationship to activity.  Past Medical History:  Diagnosis Date  . Arthritis    KNEES AND NECK; TORN MENISCUS RT KNEE  . Chest pain, atypical   . Diabetes mellitus    ORAL MEDS  . ED (erectile dysfunction)   . GERD (gastroesophageal reflux disease)   . H/O bee sting allergy   . Hyperlipidemia   . Hypertension   . LBBB (left bundle branch block)   . Lichen planus    OF MOUTH  . Obesity   . OSA (obstructive sleep apnea)    USES CPAP - DOES NOT KNOW SETTING  . Sleep apnea    wears CPAP   Past Surgical History:  Procedure Laterality Date  . ANAL FISSURE REPAIR    . CARDIAC CATHETERIZATION N/A 04/25/2015   Procedure: Left Heart Cath and Coronary Angiography;  Surgeon: Peter M  Martinique, MD;  Location: [facility];  Service: Cardiovascular;  Laterality: N/A;  . CARDIOVASCULAR STRESS TEST  01/16/2010   EF 54%  . COLONOSCOPY    . KNEE ARTHROSCOPY Right 05/10/2014   Procedure: RIGHT KNEE ARTHROSCOPY WITH PARTIAL MEDIAL MENISCECTOMY;  Surgeon: Mcarthur Rossetti, MD;  Location: [facility];  Service: Orthopedics;  Laterality: Right;  . SINUS SURGERY WITH INSTATRAK    . US ECHOCARDIOGRAPHY  09/19/2007   EF 55-60%  . VASECTOMY     Family History  Problem Relation Age of Onset  . Hypertension Mother   . Heart disease Mother   . Rheum arthritis Mother   . Kidney failure Father   . Kidney disease Father   . Heart disease Father   . Colon cancer Neg Hx   . Esophageal cancer Neg Hx   . Stomach cancer Neg Hx   . Rectal cancer Neg Hx     Social History   Tobacco Use  . Smoking status: Never Smoker  . Smokeless tobacco: Never Used  Substance Use Topics  . Alcohol use: No    Alcohol/week: 0.0 standard drinks   Marital Status: Married  ROS  Review of Systems  Constitutional: Positive for malaise/fatigue.  Cardiovascular: Positive for dyspnea on exertion. Negative for chest pain and leg swelling.  Respiratory: Positive for snoring.   Musculoskeletal: Positive for joint pain and neck pain.  Gastrointestinal:  Negative for melena.  Neurological: Negative for light-headedness.  All other systems reviewed and are negative.  Objective  Blood pressure 126/81, pulse 92, height 6\' 1"  (1.854 m), weight 298 lb (135.2 kg), SpO2 96 %.  Vitals with BMI 11/18/2020 11/15/2020 11/14/2020  Height 6\' 1"  - -  Weight 298 lbs - -  BMI 39.32 - -  Systolic 126 124 161  Diastolic 81 74 82  Pulse 92 - 97     Physical Exam Constitutional:      Comments: Morbidly obese in no acute distress.  Cardiovascular:     Rate and Rhythm: Normal rate and regular rhythm.     Pulses:          Carotid pulses are 2+ on the right side and 2+ on the left side.      Popliteal pulses are 2+  on the right side and 2+ on the left side.       Dorsalis pedis pulses are 2+ on the right side and 2+ on the left side.       Posterior tibial pulses are 2+ on the right side and 2+ on the left side.     Heart sounds: Normal heart sounds. No murmur heard. No gallop.      Comments: Femoral pulse difficult to feel due to patient's body habitus.  No leg edema. JVD difficult to see due to short neck. Pulmonary:     Effort: Pulmonary effort is normal.     Breath sounds: Normal breath sounds.  Abdominal:     General: Bowel sounds are normal.     Palpations: Abdomen is soft.     Comments: Obese. Pannus present    Laboratory examination:   No results for input(s): NA, K, CL, CO2, GLUCOSE, BUN, CREATININE, CALCIUM, GFRNONAA, GFRAA in the last 8760 hours. CrCl cannot be calculated (Patient's most recent lab result is older than the maximum 21 days allowed.).  CMP Latest Ref Rng & Units 04/21/2015 05/07/2014  Glucose 70 - 99 mg/dL 096(E) 454(U)  BUN 6 - 23 mg/dL 18 21  Creatinine 9.81 - 1.35 mg/dL 1.91 4.78  Sodium 295 - 145 mEq/L 138 137  Potassium 3.5 - 5.3 mEq/L 4.5 4.3  Chloride 96 - 112 mEq/L 99 97  CO2 19 - 32 mEq/L 25 28  Calcium 8.4 - 10.5 mg/dL 9.4 9.5   CBC Latest Ref Rng & Units 04/21/2015 05/07/2014  WBC 4.0 - 10.5 K/uL 5.8 6.3  Hemoglobin 13.0 - 17.0 g/dL 62.1 30.8  Hematocrit 65.7 - 52.0 % 45.7 40.9  Platelets 150 - 400 K/uL 252 234     External labs:    Cholesterol, total 195.000 m 03/05/2020 HDL 37 MG/DL 07/02/6961 LDL 952.841 m 03/05/2020 Triglycerides 236.000 03/05/2020  A1C 6.600 % 03/10/2020 TSH 1.330 03/05/2020   Medications and allergies   Allergies  Allergen Reactions  . Bee Venom Anaphylaxis  . Bydureon [Exenatide] Rash  . Penicillins Hives and Rash     Outpatient Medications Prior to Visit  Medication Sig Dispense Refill  . aspirin 325 MG tablet Take 325 mg by mouth daily.    . Dulaglutide 1.5 MG/0.5ML SOPN Inject 1.5 mg into the skin once a week. sunday     . EPINEPHrine (EPIPEN JR) 0.15 MG/0.3ML injection Inject 0.15 mg into the muscle as needed for anaphylaxis (bee venom).    . Misc Natural Products (AIRBORNE ELDERBERRY) CHEW Chew 1 tablet by mouth daily.    Marland Kitchen omeprazole (PRILOSEC) 20 MG capsule Take 20 mg  by mouth daily.    . rosuvastatin (CRESTOR) 10 MG tablet Take 10 mg by mouth every other day.    . sertraline (ZOLOFT) 25 MG tablet Take 25 mg by mouth every morning.    . sildenafil (REVATIO) 20 MG tablet Take 20 mg by mouth as needed (erectile dysfunction).     . tamsulosin (FLOMAX) 0.4 MG CAPS capsule Take 0.4 mg by mouth.    . telmisartan-hydrochlorothiazide (MICARDIS HCT) 80-25 MG tablet Take 1 tablet by mouth daily.    Marland Kitchen amLODipine (NORVASC) 10 MG tablet Take 10 mg by mouth daily.    . Multiple Vitamin (MULTIVITAMIN) tablet Take 1 tablet by mouth daily. (Patient not taking: Reported on 11/18/2020)    . neomycin-polymyxin-hydrocortisone (CORTISPORIN) OTIC solution Apply 1-2 drops to toe after soaking BID 10 mL 1  . aspirin 81 MG tablet Take 81 mg by mouth daily. (Patient not taking: Reported on 11/18/2020)    . diclofenac (VOLTAREN) 75 MG EC tablet Take 1 tablet (75 mg total) by mouth 2 (two) times daily. (Patient not taking: Reported on 11/18/2020) 50 tablet 2  . methylPREDNISolone (MEDROL) 4 MG tablet Take as directed (Patient not taking: Reported on 11/18/2020) 21 tablet 0  . traMADol (ULTRAM) 50 MG tablet Take 50 mg by mouth every 6 (six) hours as needed for moderate pain.  (Patient not taking: Reported on 11/18/2020)     No facility-administered medications prior to visit.    Radiology:   Chest x-ray 04/22/2015: The lungs are adequately inflated and clear. The heart is top-normal in size but stable. The pulmonary vascularity is within the limits of normal. There is no pleural effusion. The mediastinum is normal in width. The bony thorax exhibits no acute abnormality.  IMPRESSION: There is no active cardiopulmonary  disease.  Cardiac Studies:   Cardiac catheterization 04/25/2015: Right dominant circulation, normal coronary arteries.  Normal LV systolic function, EF 55 to 65%.    Myocardial perfusion scan 04/17/2015: Myocardial perfusion is abnormal. Moderate sized and intensity, reversible anterior, anteroseptal, septal and apical defect. Findings consistent with ischemia. LBBB is present and can also lead to septal defect, but this is usually a fixed defect. Ischemia is favored. This is an intermediate risk study. Overall left ventricular systolic function was abnormal. LV cavity size is normal. The left ventricular ejection fraction is mildly decreased (50%). There is no prior study for comparison.  EKG:    EKG 11/18/2020: Sinus tachycardia at rate of 100 bpm, left atrial enlargement, left bundle branch block.  No further analysis.  No change compared to 11/14/2020.  No change in left bundle branch block, compared to 03/27/2014.  Heart rate was 90 bpm.  Assessment     ICD-10-CM   1. LBBB (left bundle branch block)  I44.7 EKG 12-Lead    PCV ECHOCARDIOGRAM COMPLETE    PCV MYOCARDIAL PERFUSION WITH LEXISCAN  2. Dyspnea on exertion  R06.00 PCV ECHOCARDIOGRAM COMPLETE    PCV MYOCARDIAL PERFUSION WITH LEXISCAN  3. Chronic diastolic heart failure (HCC)  I50.32   4. Essential hypertension  I10 metoprolol tartrate (LOPRESSOR) 50 MG tablet  5. Type 2 diabetes mellitus without complication, without long-term current use of insulin (HCC)  E11.9   6. Hypercholesteremia  E78.00   7. Obstructive sleep apnea  G47.33 Ambulatory referral to Neurology     Medications Discontinued During This Encounter  Medication Reason  . aspirin 81 MG tablet Dose change  . traMADol (ULTRAM) 50 MG tablet Completed Course  . diclofenac (VOLTAREN) 75 MG  EC tablet Completed Course  . methylPREDNISolone (MEDROL) 4 MG tablet Completed Course  . amLODipine (NORVASC) 10 MG tablet Change in therapy    Meds ordered this encounter   Medications  . metoprolol tartrate (LOPRESSOR) 50 MG tablet    Sig: Take 1 tablet (50 mg total) by mouth 2 (two) times daily.    Dispense:  60 tablet    Refill:  2    Recommendations:   [name] [name] is a 64 y.o. Caucasian male with hypertension, hyperlipidemia, diabetes mellitus, obstructive sleep apnea on CPAP, left bundle branch block is now referred on a stat basis by [facility], AG-NP for evaluation of abnormal EKG, fatigue, headache and left upper extremity tingling and numbness associated with nausea and improved on nitroglycerin.  Patient has been evaluated in the emergency room and also with EMS, due to long wait in the ED, walked out AMA 2 days ago on 11/15/2020.  Patient has significant cardiovascular risk factors including morbid obesity, underlying sinus tachycardia and left bundle branch block along with hypertension, hyperlipidemia and diabetes mellitus.  Discontinue amlodipine and start him on metoprolol tartrate 50 mg twice daily. This is for HTN and also resting sinus tachycardia. Will schedule for an echocardiogram. Schedule for a Lexiscan Nuclear stress test to evaluate for myocardial ischemia. Patient unable to do treadmill stress testing due to LBBB and dyspnea.  Needs Sleep study given severity of symptoms suggestive of sleep apnea that may not be well controlled and he has been compliant with his CPAP (Dr. Brett Fairy). DM appears well controlled and he has not started lipid lowering therapy, but has Rx and will do so from today.   Weight loss discussed with the patient. Office visit following the work-up/investigations. I spent > 70 min in the evaluation of the patient and review of his external records and coordination of care.       Adrian Prows, MD, [facility] 11/18/2020, 12:36 PM Office: [phone]

## 2020-11-20 ENCOUNTER — Ambulatory Visit: Payer: Self-pay | Admitting: Cardiology

## 2020-11-24 ENCOUNTER — Ambulatory Visit: Payer: Medicare Other

## 2020-11-24 ENCOUNTER — Other Ambulatory Visit: Payer: Self-pay

## 2020-11-24 DIAGNOSIS — R0600 Dyspnea, unspecified: Secondary | ICD-10-CM

## 2020-11-24 DIAGNOSIS — R0609 Other forms of dyspnea: Secondary | ICD-10-CM

## 2020-11-24 DIAGNOSIS — I447 Left bundle-branch block, unspecified: Secondary | ICD-10-CM

## 2020-11-27 NOTE — Progress Notes (Signed)
The scar could be due to LBBB> will wait on echo and then make final decision

## 2020-12-05 ENCOUNTER — Other Ambulatory Visit: Payer: Medicare Other

## 2020-12-08 ENCOUNTER — Encounter: Payer: Self-pay | Admitting: Cardiology

## 2020-12-10 ENCOUNTER — Encounter: Payer: Self-pay | Admitting: Cardiology

## 2020-12-12 ENCOUNTER — Ambulatory Visit: Payer: Medicare Other | Admitting: Cardiology

## 2020-12-30 ENCOUNTER — Ambulatory Visit: Payer: Medicare Other | Admitting: Neurology

## 2020-12-30 ENCOUNTER — Encounter: Payer: Self-pay | Admitting: Neurology

## 2020-12-30 VITALS — BP 138/78 | HR 84 | Ht 72.0 in | Wt 306.0 lb

## 2020-12-30 DIAGNOSIS — R351 Nocturia: Secondary | ICD-10-CM | POA: Diagnosis not present

## 2020-12-30 DIAGNOSIS — G4733 Obstructive sleep apnea (adult) (pediatric): Secondary | ICD-10-CM | POA: Diagnosis not present

## 2020-12-30 DIAGNOSIS — R5383 Other fatigue: Secondary | ICD-10-CM

## 2020-12-30 DIAGNOSIS — Z9989 Dependence on other enabling machines and devices: Secondary | ICD-10-CM

## 2020-12-30 NOTE — Progress Notes (Signed)
Subjective:    Patient ID: [name] [name] is a 65 y.o. male.  HPI     Interim history:   [name] [name] is a 65 year old left-handed gentleman with an underlying medical history of diabetes, hypertension, hyperlipidemia, ED, and obesity, who Presents for reevaluation of his obstructive sleep apnea, he is referred by his cardiologist.  I had evaluated him on 01/25/2019 and recommended a sleep study but he did not have a sleep test at the time secondary to the start of the pandemic. He was previously diagnosed with obstructive sleep apnea and placed on CPAP therapy.  He has an older CPAP machine and has not been able to get updated supplies.  Today, 12/30/2020: He reports that he uses his old machine religiously.  He did not bring his machine today.  He has a [employer] escape to [employer].  He uses a full facemask.  He has not been able to get supplies from his DME company and has bought supplies online.  His previous DME company was [facility].  Bedtime is around 930 and rise time between 7 and 8.  He retired in September 2021.  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  He is a non-smoker and does not drink any alcohol but does like to drink caffeine in the form of coffee, about 3 cups in the morning.  His weight has been fairly stable.  He lives with his wife and they have 1 dog in the household.  He has an Epworth sleepiness score 6 out of 24, fatigue severity score is 34 out of 63.  He believes that his fatigue/energy level has been affected by the recent addition of a beta-blocker.  It has made him tired but not frankly sleepy.  He has an appointment with his cardiologist coming up soon, Dr. Jacinto Halim.  The patient's allergies, current medications, family history, past medical history, past social history, past surgical history and problem list were reviewed and updated as appropriate.   Previously:   01/25/19: (He) was previously diagnosed with obstructive sleep apnea and placed on CPAP therapy. I  reviewed your office note from 12/08/2018, which you kindly included. He had a sleep study in 2000 for which I was able to review. This was a split-night sleep study. He was noted to have loud snoring. Total AHI was not reported. He was titrated on CPAP up to a pressure of 14 cm. A CPAP compliance download was not available for my review today. The patient has also seen Dr. Shelle Iron in the past. He reports compliance with his CPAP but he is unable to get new supplies. His current DME company is [facility]. His machine is older, about 67 years old. His Epworth sleepiness score is 5 out of 24 today, fatigue score is 18 out of 63. He is married and lives with his wife. They have 2 children. He is a nonsmoker and does not utilize alcohol and drinks caffeine in the form of coffee, 3-4 cups per day in the mornings typically. He works for a sporting Education officer, museum. He plans to retire in a year or year and a half. He lives with his wife, they have 2 daughters, one dog in the household concurrently 1 cat. He does not watch TV in the bedroom. He has nocturia about twice per average night and denies recurrent morning headaches, has had the occasional morning headache in the context of sinus congestion. He believes that his father had sleep apnea but was not on CPAP  therapy. His bedtime is around 9, rise time around 6 or 7 currently. He is trying to lose weight. He has lost about 25 pounds within the last year. He had a tonsillectomy at age 65. He reports numbness in the left more than right hand especially his fingers, especially exacerbated with typing. He has more symptoms at night also, left more than right. He's wondering if he has carpal tunnel syndrome.   His Past Medical History Is Significant For: Past Medical History:  Diagnosis Date   Arthritis    KNEES AND NECK; TORN MENISCUS RT KNEE   Chest pain, atypical    Diabetes mellitus    ORAL MEDS   ED (erectile dysfunction)    GERD (gastroesophageal  reflux disease)    H/O bee sting allergy    Hyperlipidemia    Hypertension    LBBB (left bundle branch block)    Lichen planus    OF MOUTH   Obesity    OSA (obstructive sleep apnea)    USES CPAP - DOES NOT KNOW SETTING   Sleep apnea    wears CPAP    His Past Surgical History Is Significant For: Past Surgical History:  Procedure Laterality Date   ANAL FISSURE REPAIR     CARDIAC CATHETERIZATION N/A 04/25/2015   Procedure: Left Heart Cath and Coronary Angiography;  Surgeon: Peter M Martinique, MD;  Location: [facility];  Service: Cardiovascular;  Laterality: N/A;   CARDIOVASCULAR STRESS TEST  01/16/2010   EF 54%   COLONOSCOPY     KNEE ARTHROSCOPY Right 05/10/2014   Procedure: RIGHT KNEE ARTHROSCOPY WITH PARTIAL MEDIAL MENISCECTOMY;  Surgeon: Mcarthur Rossetti, MD;  Location: [facility];  Service: Orthopedics;  Laterality: Right;   SINUS SURGERY WITH INSTATRAK     US ECHOCARDIOGRAPHY  09/19/2007   EF 55-60%   VASECTOMY      His Family History Is Significant For: Family History  Problem Relation Age of Onset   Hypertension Mother    Heart disease Mother    Rheum arthritis Mother    Kidney failure Father    Kidney disease Father    Heart disease Father    Colon cancer Neg Hx    Esophageal cancer Neg Hx    Stomach cancer Neg Hx    Rectal cancer Neg Hx     His Social History Is Significant For: Social History   Socioeconomic History   Marital status: Married    Spouse name: Pamala Hurry   Number of children: Y   Years of education: Not on file   Highest education level: Not on file  Occupational History   Occupation: Press photographer Rep   Tobacco Use   Smoking status: Never Smoker   Smokeless tobacco: Never Used  Scientific laboratory technician Use: Never used  Substance and Sexual Activity   Alcohol use: No    Alcohol/week: 0.0 standard drinks   Drug use: No   Sexual activity: Yes  Other Topics Concern   Not on file  Social History Narrative    Not on file   Social Determinants of Health   Financial Resource Strain: Not on file  Food Insecurity: Not on file  Transportation Needs: Not on file  Physical Activity: Not on file  Stress: Not on file  Social Connections: Not on file    His Allergies Are:  Allergies  Allergen Reactions   Bee Venom Anaphylaxis   Bydureon [Exenatide] Rash   Penicillins Hives and Rash  :   His  Current Medications Are:  Outpatient Encounter Medications as of 12/30/2020  Medication Sig   aspirin 325 MG tablet Take 325 mg by mouth daily.   Dulaglutide 1.5 MG/0.5ML SOPN Inject 1.5 mg into the skin once a week. sunday   EPINEPHrine (EPIPEN JR) 0.15 MG/0.3ML injection Inject 0.15 mg into the muscle as needed for anaphylaxis (bee venom).   metoprolol tartrate (LOPRESSOR) 50 MG tablet Take 1 tablet (50 mg total) by mouth 2 (two) times daily.   Misc Natural Products (AIRBORNE ELDERBERRY) CHEW Chew 1 tablet by mouth daily.   Multiple Vitamin (MULTIVITAMIN) tablet Take 1 tablet by mouth daily.   neomycin-polymyxin-hydrocortisone (CORTISPORIN) OTIC solution Apply 1-2 drops to toe after soaking BID   omeprazole (PRILOSEC) 20 MG capsule Take 20 mg by mouth daily.   rosuvastatin (CRESTOR) 10 MG tablet Take 10 mg by mouth every other day.   sertraline (ZOLOFT) 25 MG tablet Take 25 mg by mouth every morning.   sildenafil (REVATIO) 20 MG tablet Take 20 mg by mouth as needed (erectile dysfunction).    tamsulosin (FLOMAX) 0.4 MG CAPS capsule Take 0.4 mg by mouth.   telmisartan-hydrochlorothiazide (MICARDIS HCT) 80-25 MG tablet Take 1 tablet by mouth daily.   traMADol (ULTRAM) 50 MG tablet Take by mouth every 6 (six) hours as needed.   No facility-administered encounter medications on file as of 12/30/2020.  :  Review of Systems:  Out of a complete 14 point review of systems, all are reviewed and negative with the exception of these symptoms as listed below: Review of Systems  Neurological:        Here for sleep consult. Pt reports prior sleep study 20 + years and has been on CPAP. Pt reports cpap is several years old ( did not bring to visit). He has used [facility] in the past to obtain supplies but insurance quit paying.   Pt would like to discuss getting a new machine if possible.  Epworth Sleepiness Scale 0= would never doze 1= slight chance of dozing 2= moderate chance of dozing 3= high chance of dozing  Sitting and reading:1 Watching TV:0 Sitting inactive in a public place (ex. Theater or meeting):1 As a passenger in a car for an hour without a break:0 Lying down to rest in the afternoon:3 Sitting and talking to someone:0 Sitting quietly after lunch (no alcohol):1 In a car, while stopped in traffic:0 Total:6     Objective:  Neurological Exam  Physical Exam Physical Examination:   Vitals:   12/30/20 1101  BP: 138/78  Pulse: 84  SpO2: 97%    General Examination: The patient is a very pleasant 65 y.o. male in no acute distress. He appears well-developed and well-nourished and well groomed.   HEENT: Normocephalic, atraumatic, pupils are equal, round and reactive to light and accommodation. Extraocular tracking is good without limitation to gaze excursion or nystagmus noted. Normal smooth pursuit is noted. Hearing is is mildly impaired. Face is symmetric. Speech is clear. No nuchal rigidity noted. No carotid bruits. Airway examination reveals mild mouth dryness, adequate dental hygiene, moderate airway crowding secondary to smaller airway entry and redundant soft palate, longer.  Uvula.  Mallampati class III, tongue protrudes centrally and palate elevates symmetrically, neck circumference of 19-7/8 inches.  Chest: Clear to auscultation without wheezing, rhonchi or crackles noted.  Heart: S1+S2+0, regular and normal without murmurs, rubs or gallops noted.   Abdomen: Soft, non-tender and non-distended.  Extremities: There is no pitting edema in the distal lower  extremities bilaterally.  Skin: Warm and dry without trophic changes noted.  Musculoskeletal: exam reveals no obvious joint deformities, tenderness or joint swelling or erythema.   Neurologically:  Mental status: The patient is awake, alert and oriented in all 4 spheres. His immediate and remote memory, attention, language skills and fund of knowledge are appropriate. There is no evidence of aphasia, agnosia, apraxia or anomia. Speech is clear with normal prosody and enunciation. Thought process is linear. Mood is normal and affect is normal.  Cranial nerves II - XII are as described above under HEENT exam.  Motor exam: Normal bulk, strength and tone is noted. No tremor. Fine motor skills and coordination: grossly intact.  Cerebellar testing: No dysmetria or intention tremor.  Sensory exam: intact to light touch.   Assessment and Plan:  In summary, Sabastian Raimondi is a very pleasant 65 year old male with an underlying medical history of diabetes, hypertension, hyperlipidemia, ED, and obesity, who presents for follow-up consultation of his obstructive sleep apnea.  He was diagnosed with sleep apnea originally over 15 years ago and has an older CPAP machine.  He was not able to pursue the repeat sleep testing in 2020.  He would be willing to pursue testing at this time and qualify for new equipment.  He reports compliance with his current CPAP machine.  I suggested we proceed with a home sleep test for reevaluation.  He is willing to pursue this.  We we will plan a follow-up afterwards.  He is advised to continue to use his current machine for now.  The sleep lab staff will be in touch hopefully soon to set up his home sleep test equipment pickup date and time.  I answered all his questions today and he was in agreement with the plan. I spent 30 minutes in total face-to-face time and in reviewing records during pre-charting, more than 50% of which was spent in counseling and coordination of care,  reviewing test results, reviewing medications and treatment regimen and/or in discussing or reviewing the diagnosis of OSA, the prognosis and treatment options. Pertinent laboratory and imaging test results that were available during this visit with the patient were reviewed by me and considered in my medical decision making (see chart for details).

## 2020-12-30 NOTE — Patient Instructions (Signed)
It was nice to see you again today.  As discussed, I will order a home sleep test for reevaluation of your sleep apnea and to reestablish her diagnosis so we can order new supplies and a new machine through a DME company, we can continue to work with [facility] known [facility].  We can also establish with a new DME company if you like.  We will call you soon to discuss pick up date and time for your home test equipment, the sleep lab staff will call you directly.

## 2021-01-02 ENCOUNTER — Other Ambulatory Visit: Payer: Self-pay

## 2021-01-02 ENCOUNTER — Ambulatory Visit: Payer: Medicare Other

## 2021-01-02 DIAGNOSIS — R0609 Other forms of dyspnea: Secondary | ICD-10-CM

## 2021-01-02 DIAGNOSIS — R0600 Dyspnea, unspecified: Secondary | ICD-10-CM

## 2021-01-02 DIAGNOSIS — I447 Left bundle-branch block, unspecified: Secondary | ICD-10-CM

## 2021-01-09 ENCOUNTER — Encounter: Payer: Self-pay | Admitting: Cardiology

## 2021-01-09 ENCOUNTER — Ambulatory Visit: Payer: Medicare Other | Admitting: Cardiology

## 2021-01-09 ENCOUNTER — Other Ambulatory Visit: Payer: Self-pay

## 2021-01-09 VITALS — BP 128/78 | HR 77 | Temp 98.4°F | Resp 17 | Ht 72.0 in | Wt 300.8 lb

## 2021-01-09 DIAGNOSIS — Z6841 Body Mass Index (BMI) 40.0 and over, adult: Secondary | ICD-10-CM

## 2021-01-09 DIAGNOSIS — I447 Left bundle-branch block, unspecified: Secondary | ICD-10-CM

## 2021-01-09 DIAGNOSIS — I10 Essential (primary) hypertension: Secondary | ICD-10-CM

## 2021-01-09 DIAGNOSIS — E7800 Pure hypercholesterolemia, unspecified: Secondary | ICD-10-CM

## 2021-01-09 DIAGNOSIS — G4733 Obstructive sleep apnea (adult) (pediatric): Secondary | ICD-10-CM

## 2021-01-09 DIAGNOSIS — R0609 Other forms of dyspnea: Secondary | ICD-10-CM

## 2021-01-09 DIAGNOSIS — R0600 Dyspnea, unspecified: Secondary | ICD-10-CM

## 2021-01-09 DIAGNOSIS — I5032 Chronic diastolic (congestive) heart failure: Secondary | ICD-10-CM

## 2021-01-09 MED ORDER — METOPROLOL TARTRATE 50 MG PO TABS: 50.0000 mg | ORAL_TABLET | Freq: Two times a day (BID) | ORAL | 3 refills | Status: DC

## 2021-01-09 MED ORDER — ROSUVASTATIN CALCIUM 20 MG PO TABS: 10.0000 mg | ORAL_TABLET | ORAL | 3 refills | Status: DC

## 2021-01-09 NOTE — Progress Notes (Signed)
Primary Physician/Referring:  Marton Redwood, MD  Patient ID: [name] [name], male    DOB: Jun 03, 1956, 65 y.o.   MRN: [id number]  Chief Complaint  Patient presents with  . LBBB  . Chest Pain    4 months  . Shortness of Breath   HPI:    [name] [name]  is a 65 y.o. Caucasian male with hypertension, hyperlipidemia, diabetes mellitus, obstructive sleep apnea on CPAP, left bundle branch block who had seen a month ago when he presented with abnormal EKG, fatigue and left arm tingling and numbness which improved with nitroglycerin.  He underwent stress testing and echocardiogram and presents for follow-up.  Due to resting sinus tachycardia, I discontinued amlodipine and switch him to metoprolol which he is tolerating. Since last office visit he has made lifestyle changes, has lost about 6 to 7 pounds in weight, states that he has started to walk his dog on a daily basis and has not noticed any arm pain or arm discomfort. Dyspnea has remained stable. No PND orthopnea, no leg edema.   He has chronic neck pain and also degenerative joint disease involving bilateral knee. He has noticed occasional tingling and numbness in his left forearm. No relationship to activity.  Past Medical History:  Diagnosis Date  . Arthritis    KNEES AND NECK; TORN MENISCUS RT KNEE  . Chest pain, atypical   . Diabetes mellitus    ORAL MEDS  . ED (erectile dysfunction)   . GERD (gastroesophageal reflux disease)   . H/O bee sting allergy   . Hyperlipidemia   . Hypertension   . LBBB (left bundle branch block)   . Lichen planus    OF MOUTH  . Obesity   . OSA (obstructive sleep apnea)    USES CPAP - DOES NOT KNOW SETTING  . Sleep apnea    wears CPAP   Past Surgical History:  Procedure Laterality Date  . ANAL FISSURE REPAIR    . CARDIAC CATHETERIZATION N/A 04/25/2015   Procedure: Left Heart Cath and Coronary Angiography;  Surgeon: Peter M Martinique, MD;  Location: [facility];  Service: Cardiovascular;   Laterality: N/A;  . CARDIOVASCULAR STRESS TEST  01/16/2010   EF 54%  . COLONOSCOPY    . KNEE ARTHROSCOPY Right 05/10/2014   Procedure: RIGHT KNEE ARTHROSCOPY WITH PARTIAL MEDIAL MENISCECTOMY;  Surgeon: Mcarthur Rossetti, MD;  Location: [facility];  Service: Orthopedics;  Laterality: Right;  . SINUS SURGERY WITH INSTATRAK    . US ECHOCARDIOGRAPHY  09/19/2007   EF 55-60%  . VASECTOMY     Family History  Problem Relation Age of Onset  . Hypertension Mother   . Heart disease Mother   . Rheum arthritis Mother   . Kidney failure Father   . Kidney disease Father   . Heart disease Father   . Colon cancer Neg Hx   . Esophageal cancer Neg Hx   . Stomach cancer Neg Hx   . Rectal cancer Neg Hx     Social History   Tobacco Use  . Smoking status: Never Smoker  . Smokeless tobacco: Never Used  Substance Use Topics  . Alcohol use: No    Alcohol/week: 0.0 standard drinks   Marital Status: Married  ROS  Review of Systems  Constitutional: Positive for malaise/fatigue.  Cardiovascular: Positive for dyspnea on exertion. Negative for chest pain and leg swelling.  Respiratory: Positive for snoring.   Musculoskeletal: Positive for joint pain and neck pain.  Gastrointestinal: Negative for melena.  Neurological: Negative for light-headedness.  All other systems reviewed and are negative.  Objective  Blood pressure 128/78, pulse 77, temperature 98.4 F (36.9 C), temperature source Temporal, resp. rate 17, height 6' (1.829 m), weight (!) 300 lb 12.8 oz (136.4 kg), SpO2 98 %.  Vitals with BMI 01/09/2021 12/30/2020 11/18/2020  Height 6\' 0"  6\' 0"  6\' 1"   Weight 300 lbs 13 oz 306 lbs 298 lbs  BMI 40.79 40.08 67.61  Systolic 950 932 671  Diastolic 78 78 81  Pulse 77 84 92     Physical Exam Constitutional:      Comments: Morbidly obese in no acute distress.  Cardiovascular:     Rate and Rhythm: Normal rate and regular rhythm.     Pulses:          Carotid pulses are 2+ on the right side and 2+  on the left side.      Popliteal pulses are 2+ on the right side and 2+ on the left side.       Dorsalis pedis pulses are 2+ on the right side and 2+ on the left side.       Posterior tibial pulses are 2+ on the right side and 2+ on the left side.     Heart sounds: Normal heart sounds. No murmur heard. No gallop.      Comments: Femoral pulse difficult to feel due to patient's body habitus.  No leg edema. JVD difficult to see due to short neck. Pulmonary:     Effort: Pulmonary effort is normal.     Breath sounds: Normal breath sounds.  Abdominal:     General: Bowel sounds are normal.     Palpations: Abdomen is soft.     Comments: Obese. Pannus present    Laboratory examination:   External labs:    Cholesterol, total 188.000 m 09/09/2020 HDL 42.000 mg 09/09/2020 LDL 116.000 m 09/09/2020 Triglycerides 148.000 m 09/09/2020  A1C 6.300 % 09/09/2020 TSH 1.310 11/17/2020  Hemoglobin 15.300 g/d 03/05/2020  Creatinine, Serum 0.800 mg/ 03/05/2020 Potassium 3.900 mEq 11/17/2020 ALT (SGPT) 30.000 IU/ 09/09/2020  Cholesterol, total 195.000 m 03/05/2020 HDL 37 MG/DL 03/05/2020 LDL 111.000 m 03/05/2020 Triglycerides 236.000 03/05/2020  A1C 6.600 % 03/10/2020 TSH 1.330 03/05/2020  Medications and allergies   Allergies  Allergen Reactions  . Bee Venom Anaphylaxis  . Bydureon [Exenatide] Rash  . Penicillins Hives and Rash    Outpatient Medications Prior to Visit  Medication Sig Dispense Refill  . aspirin 325 MG tablet Take 325 mg by mouth daily.    . Dulaglutide 1.5 MG/0.5ML SOPN Inject 1.5 mg into the skin once a week. sunday    . EPINEPHrine (EPIPEN JR) 0.15 MG/0.3ML injection Inject 0.15 mg into the muscle as needed for anaphylaxis (bee venom).    . Misc Natural Products ([place] ELDERBERRY) CHEW Chew 1 tablet by mouth daily.    . Multiple Vitamin (MULTIVITAMIN) tablet Take 1 tablet by mouth daily.    Marland Kitchen omeprazole (PRILOSEC) 20 MG capsule Take 20 mg by mouth daily.    . sertraline  (ZOLOFT) 25 MG tablet Take 25 mg by mouth every morning.    . sildenafil (REVATIO) 20 MG tablet Take 20 mg by mouth as needed (erectile dysfunction).     . tamsulosin (FLOMAX) 0.4 MG CAPS capsule Take 0.4 mg by mouth.    . telmisartan-hydrochlorothiazide (MICARDIS HCT) 80-25 MG tablet Take 1 tablet by mouth daily.    . traMADol (ULTRAM) 50 MG tablet Take by mouth every  6 (six) hours as needed.    . metoprolol tartrate (LOPRESSOR) 50 MG tablet Take 1 tablet (50 mg total) by mouth 2 (two) times daily. 60 tablet 2  . rosuvastatin (CRESTOR) 10 MG tablet Take 10 mg by mouth every other day.    . neomycin-polymyxin-hydrocortisone (CORTISPORIN) OTIC solution Apply 1-2 drops to toe after soaking BID 10 mL 1   No facility-administered medications prior to visit.    Radiology:   Chest x-ray 04/22/2015: The lungs are adequately inflated and clear. The heart is top-normal in size but stable. The pulmonary vascularity is within the limits of normal. There is no pleural effusion. The mediastinum is normal in width. The bony thorax exhibits no acute abnormality.  IMPRESSION: There is no active cardiopulmonary disease.  Cardiac Studies:   Myocardial perfusion scan 04/17/2015: Myocardial perfusion is abnormal. Moderate sized and intensity, reversible anterior, anteroseptal, septal and apical defect. Findings consistent with ischemia. LBBB is present and can also lead to septal defect, but this is usually a fixed defect. Ischemia is favored. This is an intermediate risk study. Overall left ventricular systolic function was abnormal. LV cavity size is normal. The left ventricular ejection fraction is mildly decreased (50%). There is no prior study for comparison.  Cardiac catheterization 04/25/2015: Right dominant circulation, normal coronary arteries.  Normal LV systolic function, EF 55 to 65%.    Lexiscan Tetrofosmin stress test 11/24/2020: Lexiscan nuclear stress test performed using 1-day  protocol. SPECT images show large sized severe intensity, predominantly fixed perfusion defect in apical to basal inferior/inferolateral, and basal inferoseptal myocardium, with corresponding akinesis suggestive of infarct. Stress LVEF 34%. High risk study.   Compared to 04/17/2015, anterior ischemia was noted previously, patient does have a history of normal coronary angiography in 2016.  Echocardiogram 01/03/2021: Study Quality: Technically difficult study. Normal LV systolic function with visual EF 50-55%. Abnormal septal wall motion due to left bundle branch block. Left ventricle cavity is normal in size. Normal global wall motion. Normal diastolic filling pattern, normal LAP.  No significant valvular heart disease. No prior study for comparison.   EKG:    EKG 11/18/2020: Sinus tachycardia at rate of 100 bpm, left atrial enlargement, left bundle branch block.  No further analysis.  No change compared to 11/14/2020.  No change in left bundle branch block, compared to 03/27/2014.  Heart rate was 90 bpm.  Assessment     ICD-10-CM   1. LBBB (left bundle branch block)  I44.7   2. Dyspnea on exertion  R06.00   3. Chronic diastolic heart failure (HCC)  I50.32   4. Essential hypertension  I10 metoprolol tartrate (LOPRESSOR) 50 MG tablet  5. Obstructive sleep apnea  G47.33   6. Class 3 severe obesity due to excess calories with serious comorbidity and body mass index (BMI) of 40.0 to 44.9 in adult (HCC)  E66.01    Z68.41   7. Hypercholesteremia  E78.00 rosuvastatin (CRESTOR) 20 MG tablet    Medications Discontinued During This Encounter  Medication Reason  . neomycin-polymyxin-hydrocortisone (CORTISPORIN) OTIC solution No longer needed (for PRN medications)  . rosuvastatin (CRESTOR) 10 MG tablet Reorder  . metoprolol tartrate (LOPRESSOR) 50 MG tablet Reorder    Meds ordered this encounter  Medications  . metoprolol tartrate (LOPRESSOR) 50 MG tablet    Sig: Take 1 tablet (50 mg total)  by mouth 2 (two) times daily.    Dispense:  180 tablet    Refill:  3  . rosuvastatin (CRESTOR) 20 MG tablet  Sig: Take 0.5 tablets (10 mg total) by mouth every other day.    Dispense:  90 tablet    Refill:  3   No orders of the defined types were placed in this encounter.   Recommendations:   Cosby Proby is a 65 y.o. Caucasian male with hypertension, hyperlipidemia, diabetes mellitus, obstructive sleep apnea on CPAP, left bundle branch block who had seen a month ago when he presented with abnormal EKG, fatigue and left arm tingling and numbness which improved with nitroglycerin.  He underwent stress testing and echocardiogram and presents for follow-up.  Due to resting sinus tachycardia, I discontinued amlodipine and switch him to metoprolol which he is tolerating.  His blood pressure is also controlled, I refilled his prescription.  I reviewed the results of the nuclear stress test and echocardiogram with the patient.  Although nuclear stress test is high risk, in view of absence of symptoms now and he has been exercising regularly and has lost about 7 to 8 pounds, I would prefer to continue medical therapy and still proceed with cardiac catheterization.  Suspect significant soft tissue attenuation in the inferior wall and his LVEF is normal, although multivessel disease is possibility, primary prevention/secondary prevention with aggressive risk factor modification was discussed in length with the patient and his wife at the bedside.  Weight loss also was discussed extensively.  I given him [facility] low glycemic diet sheet.  I also reviewed his labs, lipids not at goal in view of diabetic status.  Will increase his Crestor to 20 mg daily, if he were to tolerate significant arthralgias, we could certainly go back down to 10 mg and add Zetia, goal LDL <70.  He has now established with Dr. Rexene Alberts with regard to obstructive sleep apnea and he is being scheduled for a sleep study at home.  No  acute decompensated heart failure today, no leg edema or JVD and lungs are clear.  Suspect part of the dyspnea is related to obesity hypoventilation.  I would like to see him back in 3 months for reiteration, follow-up on lipids and hypertension and abnormal nuclear stress test.  Patient has an appointment to see Dr. Carmie Kanner sometime in the next couple months, he will bring the labs to me.    This is a 40-minute office visit encounter.     Adrian Prows, MD, [facility] 01/09/2021, 4:10 PM Office: [phone]

## 2021-02-02 ENCOUNTER — Ambulatory Visit (INDEPENDENT_AMBULATORY_CARE_PROVIDER_SITE_OTHER): Payer: Medicare Other | Admitting: Neurology

## 2021-02-02 DIAGNOSIS — R351 Nocturia: Secondary | ICD-10-CM

## 2021-02-02 DIAGNOSIS — G4733 Obstructive sleep apnea (adult) (pediatric): Secondary | ICD-10-CM

## 2021-02-02 DIAGNOSIS — R5383 Other fatigue: Secondary | ICD-10-CM

## 2021-02-03 NOTE — Addendum Note (Signed)
Addended by: Star Age on: 02/03/2021 05:48 PM   Modules accepted: Orders

## 2021-02-03 NOTE — Procedures (Signed)
   [facility]  HOME SLEEP TEST (Watch PAT)  STUDY DATE: 02/02/21  DOB: 1956/06/08  MRN: [id number]  ORDERING CLINICIAN: Star Age, MD, PhD   REFERRING CLINICIAN: Dr. Einar Gip   CLINICAL INFORMATION/HISTORY: 63 year oldman with a history of diabetes, hypertension, hyperlipidemia, ED, and obesity, who Presents for reevaluation of his obstructive sleep apnea.  He has an older CPAP machine and has not been able to get updated supplies. He reports compliance with his CPAP.  Epworth sleepiness score: 6/24.  BMI: 41.5 kg/m  Neck Circumference: 19 7/8 "  FINDINGS:   Total Record Time (hours, min): 9 H 47 min  Total Sleep Time (hours, min):  7 H 46 min   Percent REM (%):    24.27 %   Calculated pAHI (per hour): 9.0      REM pAHI: 13.3  NREM pAHI: 7.7 Supine AHI: 11.0   Oxygen Saturation (%) Mean: 94  Minimum oxygen saturation (%):        87   O2 Saturation Range (%): 87-98  O2Saturation (minutes) <=88%: 0.1 min   Pulse Mean (bpm):    70  Pulse Range (47-96)   IMPRESSION: OSA (obstructive sleep apnea)  RECOMMENDATION:  This home sleep test confirms mild obstructive sleep apnea with a total AHI of 9/hour and O2 nadir of 87%. Snoring was noted and appeared to be mild to moderate. The patient has been on CPAP for years. He will be advised to start treatment with new equipment, in the form of autoPAP at home. A full night CPAP titration study may help with proper treatment settings and mask fitting if needed. Alternative treatments may include weight loss along with avoidance of the supine sleep position, or an oral appliance in appropriate candidates.   Please note that untreated obstructive sleep apnea may carry additional perioperative morbidity. Patients with significant obstructive sleep apnea should receive perioperative PAP therapy and the surgeons and particularly the anesthesiologist should be informed of the diagnosis and the severity of the sleep  disordered breathing. The patient should be cautioned not to drive, work at heights, or operate dangerous or heavy equipment when tired or sleepy. Review and reiteration of good sleep hygiene measures should be pursued with any patient. Other causes of the patient's symptoms, including circadian rhythm disturbances, an underlying mood disorder, medication effect and/or an underlying medical problem cannot be ruled out based on this test. Clinical correlation is recommended.   The patient and his referring provider will be notified of the test results. The patient will be seen in follow up in sleep clinic at [facility].  I certify that I have reviewed the raw data recording prior to the issuance of this report in accordance with the standards of the American Academy of Sleep Medicine (AASM).  INTERPRETING PHYSICIAN:  Star Age, MD, PhD  Board Certified in Neurology and Sleep Medicine  [facility] [address], [place], [place] [address] [phone]

## 2021-02-03 NOTE — Progress Notes (Signed)
Patient referred by Dr. Einar Gip for re-eval of his OSA, seen by me on 12/30/20, HST on 02/02/21.    Please call and notify the patient that the recent home sleep test confirmed his Dx of OSA. It is in the mild range. He has been on CPAP for years and should qualify for a new machine. To that end I will Rx an autoPAP. We can send the order to his DME company or a new company of his choice (or as per insurance requirement). The DME representative will educate him on how to use the machine. I have placed an order in the chart. Please send referral, talk to patient, send report to referring MD. We will need a FU in sleep clinic for about 10 weeks post-PAP set up, please arrange that with me or one of our NPs. Thanks,   Star Age, MD, PhD [facility])

## 2021-02-05 ENCOUNTER — Telehealth: Payer: Self-pay

## 2021-02-05 NOTE — Telephone Encounter (Signed)
I called pt. I advised pt that Dr. Rexene Alberts reviewed their sleep study results and confirmed the dx of mild OSA. Dr. Rexene Alberts recommends that pt start on an autopap for treatment. I reviewed PAP compliance expectations with the pt. Pt is agreeable to starting an auto-PAP. I advised pt that an order will be sent to a DME, [employer], and [employer] will call the pt within about one week after they file with the pt's insurance. [facility] will show the pt how to use the machine, fit for masks, and troubleshoot the auto-PAP if needed. Once pt is started on the machine we will see him back within 31-90 days. A letter with all of this information in it will be mailed to the pt as a reminder. I verified with the pt that the address we have on file is correct. Pt verbalized understanding of results. Pt had no questions at this time but was encouraged to call back if questions arise. I have sent the order to [employer] and have received confirmation that they have received the order.

## 2021-02-05 NOTE — Telephone Encounter (Signed)
-----   Message from Star Age, MD sent at 02/03/2021  5:47 PM EST ----- Patient referred by Dr. Einar Gip for re-eval of his OSA, seen by me on 12/30/20, HST on 02/02/21.    Please call and notify the patient that the recent home sleep test confirmed his Dx of OSA. It is in the mild range. He has been on CPAP for years and should qualify for a new machine. To that end I will Rx an autoPAP. We can send the order to his DME company or a new company of his choice (or as per insurance requirement). The DME representative will educate him on how to use the machine. I have placed an order in the chart. Please send referral, talk to patient, send report to referring MD. We will need a FU in sleep clinic for about 10 weeks post-PAP set up, please arrange that with me or one of our NPs. Thanks,   Star Age, MD, PhD [facility])

## 2021-02-25 ENCOUNTER — Other Ambulatory Visit: Payer: Self-pay | Admitting: Otolaryngology

## 2021-02-25 DIAGNOSIS — K118 Other diseases of salivary glands: Secondary | ICD-10-CM

## 2021-02-25 DIAGNOSIS — E669 Obesity, unspecified: Secondary | ICD-10-CM

## 2021-02-25 DIAGNOSIS — C029 Malignant neoplasm of tongue, unspecified: Secondary | ICD-10-CM

## 2021-02-27 HISTORY — PX: OTHER SURGICAL HISTORY: SHX169

## 2021-03-02 ENCOUNTER — Ambulatory Visit
Admission: RE | Admit: 2021-03-02 | Discharge: 2021-03-02 | Disposition: A | Discharge status: Home / Self Care | Payer: Medicare Other | Source: Ambulatory Visit | Attending: Otolaryngology | Admitting: Otolaryngology

## 2021-03-02 DIAGNOSIS — C029 Malignant neoplasm of tongue, unspecified: Secondary | ICD-10-CM

## 2021-03-02 DIAGNOSIS — K118 Other diseases of salivary glands: Secondary | ICD-10-CM

## 2021-03-02 DIAGNOSIS — E669 Obesity, unspecified: Secondary | ICD-10-CM

## 2021-03-02 MED ORDER — IOPAMIDOL (ISOVUE-300) INJECTION 61%
75.0000 mL | Freq: Once | INTRAVENOUS | Status: AC | PRN
  Administered 2021-03-02: 75 mL via INTRAVENOUS

## 2021-03-09 ENCOUNTER — Other Ambulatory Visit (HOSPITAL_COMMUNITY)
Admission: RE | Admit: 2021-03-09 | Discharge: 2021-03-09 | Disposition: A | Discharge status: Home / Self Care | Payer: Medicare Other | Source: Ambulatory Visit | Attending: Otolaryngology | Admitting: Otolaryngology

## 2021-03-09 DIAGNOSIS — Z01812 Encounter for preprocedural laboratory examination: Secondary | ICD-10-CM | POA: Diagnosis present

## 2021-03-09 DIAGNOSIS — Z20822 Contact with and (suspected) exposure to covid-19: Secondary | ICD-10-CM | POA: Insufficient documentation

## 2021-03-09 LAB — SARS CORONAVIRUS 2 (TAT 6-24 HRS): SARS Coronavirus 2: NEGATIVE

## 2021-03-09 NOTE — H&P (Signed)
HPI:   [name] [name] is a 65 y.o. male who presents as a consult Patient.   Referring Provider: Jacqulyn Cane*  Chief complaint: Tongue cancer.  HPI: History of lichen planus in his mouth for the past 6 or 7 years. He had a benign lesion removed from his tongue about 6 or 7 years ago. He did well until earlier this past year when he developed a sore in the right side of his tongue that has recently enlarged. He had a biopsy of that which was sent to [facility] and is consistent with squamous cell carcinoma, without HPV. He does not smoke or drink. He has lost a few pounds recently due to the discomfort.  PMH/Meds/All/SocHx/FamHx/ROS:   Past Medical History:  Diagnosis Date  . Cancer ([place])  . Diabetes mellitus ([place])  . Hyperlipemia  . Hypertension   Past Surgical History:  Procedure Laterality Date  . KNEE CARTILAGE SURGERY  . TONGUE BIOPSY   No family history of bleeding disorders, wound healing problems or difficulty with anesthesia.   Social History   Socioeconomic History  . Marital status: Married  Spouse name: Not on file  . Number of children: Not on file  . Years of education: Not on file  . Highest education level: Not on file  Occupational History  . Not on file  Tobacco Use  . Smoking status: Never Smoker  . Smokeless tobacco: Never Used  Vaping Use  . Vaping Use: Never used  Substance and Sexual Activity  . Alcohol use: Not on file  . Drug use: Not on file  . Sexual activity: Not on file  Other Topics Concern  . Not on file  Social History Narrative  . Not on file   Social Determinants of Health   Financial Resource Strain: Not on file  Food Insecurity: Not on file  Transportation Needs: Not on file  Physical Activity: Not on file  Stress: Not on file  Social Connections: Not on file  Housing Stability: Not on file   Current Outpatient Medications:  . chlorhexidine (PERIDEX) 0.12 % solution, , Disp: , Rfl:  . ELDERBERRY FRUIT ORAL,  Take by mouth., Disp: , Rfl:  . EPINEPHrine (EPIPEN JR) 0.15 mg/0.3 mL injection, Inject 0.15 mg into the muscle., Disp: , Rfl:  . ergocalciferol (VITAMIN D2) 1,250 mcg (50,000 unit) capsule, Take 50,000 Units by mouth once a week., Disp: , Rfl:  . HYDROcodone-acetaminophen (NORCO) 5-325 mg per tablet, , Disp: , Rfl:  . LIDOCAINE 2 % solution, , Disp: , Rfl:  . metoPROLOL tartrate (LOPRESSOR) 50 MG tablet, , Disp: , Rfl:  . multivitamin capsule, Take 1 capsule by mouth daily., Disp: , Rfl:  . omeprazole (PRILOSEC) 20 MG capsule, Take 20 mg by mouth daily., Disp: , Rfl:  . rosuvastatin (CRESTOR) 10 MG tablet, , Disp: , Rfl:  . sertraline (ZOLOFT) 25 MG tablet, Take 25 mg by mouth every morning., Disp: , Rfl:  . sildenafiL, pulm.hypertension, (REVATIO) 20 mg tablet, Take 20 mg by mouth., Disp: , Rfl:  . tamsulosin (FLOMAX) 0.4 mg Cap capsule, , Disp: , Rfl:  . telmisartan-hydrochlorothiazide (MICARDIS HCT) 80-25 mg per tablet, Take 1 tablet by mouth daily., Disp: , Rfl:  . traMADoL (ULTRAM) 50 mg tablet, , Disp: , Rfl:  . turmeric (CURCUMIN MISC), by Misc.(Non-Drug; Combo Route) route., Disp: , Rfl:  . zinc sulfate (ZINC-15 ORAL), Take by mouth., Disp: , Rfl:   A complete ROS was performed with pertinent positives/negatives noted in  the HPI. The remainder of the ROS are negative.   Physical Exam:   BP 162/75  Pulse 96  Temp 97.8 F (36.6 C)  Ht 1.829 m (6')  Wt 134.7 kg (297 lb)  BMI 40.28 kg/m   General: Healthy and alert, in no distress, breathing easily. Normal affect. In a pleasant mood. Head: Normocephalic, atraumatic. No masses, or scars. Eyes: Pupils are equal, and reactive to light. Vision is grossly intact. No spontaneous or gaze nystagmus. Ears: Ear canals are clear. Tympanic membranes are intact, with normal landmarks and the middle ears are clear and healthy. Hearing: Grossly normal. Nose: Nasal cavities are clear with healthy mucosa, no polyps or exudate. Airways are  patent. Face: No masses or scars, facial nerve function is symmetric. Oral Cavity: 1.5 cm mostly raised superficial lesion involving the right side lateral oral tongue. Tongue with normal mobility. Dentition appears healthy. Oropharynx: Tonsils are symmetric. There are no mucosal masses identified. Tongue base appears normal and healthy. Larynx/Hypopharynx: deferred Chest: Deferred Neck: There is a 2 cm rubbery mass in the tail of the right parotid, no cervical adenopathy, no thyroid nodules or enlargement. Neuro: Cranial nerves II-XII with normal function. Balance: Normal gate. Other findings: none.  Independent Review of Additional Tests or Records:  none  Procedures:  none  Impression & Plans:  1. Incidental right parotid mass. CT imaging will help to delineate the nature of this.  2. Tongue cancer. He is going to need a CT of the neck to rule out adenopathy but also to give more information about the parotid mass. He is going to need a PET scan also to evaluate for any adenopathy or distant disease. Recommend partial glossectomy for this. We will schedule that as soon as the imaging has been scheduled. We will try to obtain the biopsy slides from [facility] to review here. We discussed the nature of the oral portion of the surgery and a little bit about nodal dissection. All questions were answered. Recommend he continue to work on his diet to make sure he is getting adequate amounts of protein.

## 2021-03-10 ENCOUNTER — Encounter (HOSPITAL_COMMUNITY): Payer: Self-pay | Admitting: Otolaryngology

## 2021-03-10 NOTE — Progress Notes (Signed)
Spoke with pt for pre-op call. Pt has hx of sinus tachycardia and sees Dr. Einar Gip. Denies any recent chest pain or shortness of breath. Pt is a type 2 diabetic. States his last A1C was 6.8 about 4 months ago. States he only takes Trulicity once a week. He states he does not check his blood sugar at home. Pt took Trulicity today.  Covid test done 03/09/21 and it's negative. Pt states he's been in quarantine since the test was done and understands that he stays in quarantine until he comes to the hospital tomorrow.

## 2021-03-10 NOTE — Anesthesia Preprocedure Evaluation (Addendum)
Anesthesia Evaluation  Patient identified by MRN, date of birth, ID band Patient awake    Reviewed: Allergy & Precautions, NPO status , Patient's Chart, lab work & pertinent test results  History of Anesthesia Complications Negative for: history of anesthetic complications  Airway Mallampati: II  TM Distance: >3 FB Neck ROM: Full    Dental  (+) Teeth Intact   Pulmonary sleep apnea and Continuous Positive Airway Pressure Ventilation ,    Pulmonary exam normal        Cardiovascular hypertension, Normal cardiovascular exam     Neuro/Psych negative neurological ROS     GI/Hepatic Neg liver ROS, GERD  ,  Endo/Other  diabetes, Oral Hypoglycemic Agents  Renal/GU negative Renal ROS  negative genitourinary   Musculoskeletal negative musculoskeletal ROS (+)   Abdominal   Peds  Hematology negative hematology ROS (+)   Anesthesia Other Findings  Squamous cell carcinoma of tongue  In a letter dated 12/10/2020, Dr. Einar Gip previously cleared the patient as low risk from a cardiac standpoint undergo excisional biopsy of the tongue under general anesthesia stating, "[name] [name] is at low risk, from a cardiac standpoint, for his upcoming procedure: Excisional biopsy of the tongue under general anesthesia.  It is ok to proceed without further cardiac testing. Nuclear stress test on 11/24/2020 revealed fixed defect in inferior wall and septum and low EF. But clinically he has no e/o CHF and suspect the defect is due to underlying LBBB on EKG. If applicable can hold ASA for 6  day(s) prior to procedure and re-start same or next  days post procedure. No antibiotic prophylaxis is needed."  Echocardiogram 01/03/2021: Study Quality: Technically difficult study. Normal LV systolic function with visual EF 50-55%. Abnormal septal wall motion due to left bundle branch block. Left ventricle cavity is normal in size. Normal global wall motion.  Normal diastolic filling pattern, normal LAP.  No significant valvular heart disease. No prior study for comparison.  Reproductive/Obstetrics                           Anesthesia Physical Anesthesia Plan  ASA: III  Anesthesia Plan: General   Post-op Pain Management:    Induction: Intravenous  PONV Risk Score and Plan: 3 and Ondansetron, Dexamethasone, Treatment may vary due to age or medical condition and Midazolam  Airway Management Planned: Oral ETT and Nasal ETT  Additional Equipment: None  Intra-op Plan:   Post-operative Plan: Extubation in OR  Informed Consent: I have reviewed the patients History and Physical, chart, labs and discussed the procedure including the risks, benefits and alternatives for the proposed anesthesia with the patient or authorized representative who has indicated his/her understanding and acceptance.     Dental advisory given  Plan Discussed with:   Anesthesia Plan Comments: (PAT note by Karoline Caldwell, PA-C: Follows with cardiologist Dr. Einar Gip for hx of hypertension, hyperlipidemia, diabetes mellitus, obstructive sleep apnea on CPAP, left bundle branch block.  Recently underwent nuclear stress test 11/24/2020 showing large sized severe intensity, predominantly fixed perfusion defect in apical to basal inferior/inferolateral, and basal inferoseptal myocardium, with corresponding akinesis s/o infarct. Stress LVEF 34%.  This was read as high risk.  The patient subsequently had an echocardiogram 01/03/2021 showing EF 50 to 55%, abnormal septal wall motion due to left bundle branch block, no significant valvular heart disease.  Last seen by Dr. Einar Gip on 01/09/2021 and at that time the pt was asymptomatic from a CV standpoint and noted to  be exercising regularly. He was advised to continue risk factor modification and follow-up in 3 months.  In a letter dated 12/10/2020, Dr. Einar Gip previously cleared the patient as low risk from a cardiac  standpoint undergo excisional biopsy of the tongue under general anesthesia stating, "[name] [name] is at low risk, from a cardiac standpoint, for his upcoming procedure: Excisional biopsy of the tongue under general anesthesia.  It is ok to proceed without further cardiac testing. Nuclear stress test on 11/24/2020 revealed fixed defect in inferior wall and septum and low EF. But clinically he has no e/o CHF and suspect the defect is due to underlying LBBB on EKG. If applicable can hold ASA for 6  day(s) prior to procedure and re-start same or next  days post procedure. No antibiotic prophylaxis is needed."  Recent diagnosis of squamous cell carcinoma of the tongue, followed by Dr. Constance Holster.  Reviewed patient's history with anesthesiologist Dr. Kalman Shan.  He advised patient can proceed as planned barring acute status change.  Patient will need day of surgery labs and evaluation.  EKG 11/18/2020: Sinus tachycardia at rate of 100 bpm, left atrial enlargement, left bundle branch block.  No further analysis.  No change compared to 11/14/2020.  Echocardiogram 01/03/2021: Study Quality: Technically difficult study. Normal LV systolic function with visual EF 50-55%. Abnormal septal wall motion due to left bundle branch block. Left ventricle cavity is normal in size. Normal global wall motion. Normal diastolic filling pattern, normal LAP.  No significant valvular heart disease. No prior study for comparison.  Lexiscan Tetrofosmin stress test 11/24/2020: Lexiscan nuclear stress test performed using 1-day protocol. SPECT images show large sized severe intensity, predominantly fixed perfusion defect in apical to basal inferior/inferolateral, and basal inferoseptal myocardium, with corresponding akinesis suggestive of infarct. Stress LVEF 34%. High risk study.   Compared to 04/17/2015, anterior ischemia was noted previously, patient does have a history of normal coronary angiography in 2016. )        Anesthesia Quick Evaluation

## 2021-03-10 NOTE — Progress Notes (Signed)
Anesthesia Chart Review: Same day workup  Follows with cardiologist Dr. Einar Gip for hx of hypertension, hyperlipidemia, diabetes mellitus, obstructive sleep apnea on CPAP, left bundle branch block.  Recently underwent nuclear stress test 11/24/2020 showing large sized severe intensity, predominantly fixed perfusion defect in apical to basal inferior/inferolateral, and basal inferoseptal myocardium, with corresponding akinesis s/o infarct. Stress LVEF 34%.  This was read as high risk.  The patient subsequently had an echocardiogram 01/03/2021 showing EF 50 to 55%, abnormal septal wall motion due to left bundle branch block, no significant valvular heart disease.  Last seen by Dr. Einar Gip on 01/09/2021 and at that time the pt was asymptomatic from a CV standpoint and noted to be exercising regularly. He was advised to continue risk factor modification and follow-up in 3 months.  In a letter dated 12/10/2020, Dr. Einar Gip previously cleared the patient as low risk from a cardiac standpoint undergo excisional biopsy of the tongue under general anesthesia stating, "[name] [name] is at low risk, from a cardiac standpoint, for his upcoming procedure: Excisional biopsy of the tongue under general anesthesia.  It is ok to proceed without further cardiac testing. Nuclear stress test on 11/24/2020 revealed fixed defect in inferior wall and septum and low EF. But clinically he has no e/o CHF and suspect the defect is due to underlying LBBB on EKG. If applicable can hold ASA for 6  day(s) prior to procedure and re-start same or next  days post procedure. No antibiotic prophylaxis is needed."  Recent diagnosis of squamous cell carcinoma of the tongue, followed by Dr. Constance Holster.  Reviewed patient's history with anesthesiologist Dr. Kalman Shan.  He advised patient can proceed as planned barring acute status change.  Patient will need day of surgery labs and evaluation.  EKG 11/18/2020: Sinus tachycardia at rate of 100 bpm, left atrial  enlargement, left bundle branch block.  No further analysis.  No change compared to 11/14/2020.  Echocardiogram 01/03/2021: Study Quality: Technically difficult study. Normal LV systolic function with visual EF 50-55%. Abnormal septal wall motion due to left bundle branch block. Left ventricle cavity is normal in size. Normal global wall motion. Normal diastolic filling pattern, normal LAP.  No significant valvular heart disease. No prior study for comparison.  Lexiscan Tetrofosmin stress test 11/24/2020: Lexiscan nuclear stress test performed using 1-day protocol. SPECT images show large sized severe intensity, predominantly fixed perfusion defect in apical to basal inferior/inferolateral, and basal inferoseptal myocardium, with corresponding akinesis suggestive of infarct. Stress LVEF 34%. High risk study.   Compared to 04/17/2015, anterior ischemia was noted previously, patient does have a history of normal coronary angiography in 2016.    Wynonia Musty [facility] Stay [facility]/Anesthesiology Phone [phone] 03/10/2021 10:23 AM

## 2021-03-11 ENCOUNTER — Other Ambulatory Visit: Payer: Self-pay | Admitting: Otolaryngology

## 2021-03-11 ENCOUNTER — Other Ambulatory Visit (HOSPITAL_COMMUNITY): Payer: Self-pay | Admitting: Otolaryngology

## 2021-03-11 ENCOUNTER — Other Ambulatory Visit: Payer: Self-pay

## 2021-03-11 ENCOUNTER — Observation Stay (HOSPITAL_COMMUNITY)
Admission: RE | Admit: 2021-03-11 | Discharge: 2021-03-12 | Disposition: A | Discharge status: Home / Self Care | Payer: Medicare Other | Attending: Otolaryngology | Admitting: Otolaryngology

## 2021-03-11 ENCOUNTER — Ambulatory Visit (HOSPITAL_COMMUNITY): Payer: Medicare Other | Admitting: Physician Assistant

## 2021-03-11 ENCOUNTER — Encounter (HOSPITAL_COMMUNITY): Payer: Self-pay | Admitting: Otolaryngology

## 2021-03-11 ENCOUNTER — Encounter (HOSPITAL_COMMUNITY)
Admission: RE | Disposition: A | Discharge status: Home / Self Care | Payer: Self-pay | Source: Home / Self Care | Attending: Otolaryngology

## 2021-03-11 DIAGNOSIS — E119 Type 2 diabetes mellitus without complications: Secondary | ICD-10-CM | POA: Diagnosis not present

## 2021-03-11 DIAGNOSIS — Z79899 Other long term (current) drug therapy: Secondary | ICD-10-CM | POA: Diagnosis not present

## 2021-03-11 DIAGNOSIS — C029 Malignant neoplasm of tongue, unspecified: Secondary | ICD-10-CM | POA: Diagnosis not present

## 2021-03-11 DIAGNOSIS — I10 Essential (primary) hypertension: Secondary | ICD-10-CM | POA: Diagnosis not present

## 2021-03-11 DIAGNOSIS — C021 Malignant neoplasm of border of tongue: Secondary | ICD-10-CM | POA: Diagnosis present

## 2021-03-11 HISTORY — DX: Personal history of urinary calculi: Z87.442

## 2021-03-11 LAB — BASIC METABOLIC PANEL
Anion gap: 8 (ref 5–15)
BUN: 13 mg/dL (ref 8–23)
CO2: 27 mmol/L (ref 22–32)
Calcium: 9.5 mg/dL (ref 8.9–10.3)
Chloride: 102 mmol/L (ref 98–111)
Creatinine, Ser: 0.88 mg/dL (ref 0.61–1.24)
GFR, Estimated: >60 mL/min (ref >60)
Glucose, Bld: 138 mg/dL — ABNORMAL HIGH (ref 70–99)
Potassium: 3.8 mmol/L (ref 3.5–5.1)
Sodium: 137 mmol/L (ref 135–145)

## 2021-03-11 LAB — CBC
HCT: 42.8 % (ref 39.0–52.0)
Hemoglobin: 14.6 g/dL (ref 13.0–17.0)
MCH: 30.2 pg (ref 26.0–34.0)
MCHC: 34.1 g/dL (ref 30.0–36.0)
MCV: 88.6 fL (ref 80.0–100.0)
Platelets: 227 10*3/uL (ref 150–400)
RBC: 4.83 MIL/uL (ref 4.22–5.81)
RDW: 13.1 % (ref 11.5–15.5)
WBC: 5.6 10*3/uL (ref 4.0–10.5)
nRBC: 0 % (ref 0.0–0.2)

## 2021-03-11 LAB — GLUCOSE, CAPILLARY
Glucose-Capillary: 136 mg/dL — ABNORMAL HIGH (ref 70–99)
Glucose-Capillary: 125 mg/dL — ABNORMAL HIGH (ref 70–99)
Glucose-Capillary: 155 mg/dL — ABNORMAL HIGH (ref 70–99)
Glucose-Capillary: 145 mg/dL — ABNORMAL HIGH (ref 70–99)
Glucose-Capillary: 202 mg/dL — ABNORMAL HIGH (ref 70–99)

## 2021-03-11 SURGERY — GLOSSECTOMY, PARTIAL
Anesthesia: General | Site: Mouth | Laterality: Right

## 2021-03-11 MED ORDER — 0.9 % SODIUM CHLORIDE (POUR BTL) OPTIME
TOPICAL | Status: DC | PRN
  Administered 2021-03-11: 1000 mL

## 2021-03-11 MED ORDER — HYDROCHLOROTHIAZIDE 25 MG PO TABS
25.0000 mg | ORAL_TABLET | Freq: Every day | ORAL | Status: DC
  Administered 2021-03-11 – 2021-03-12 (×2): 25 mg via ORAL
  Filled 2021-03-11 (×2): qty 1

## 2021-03-11 MED ORDER — TURMERIC CURCUMIN PO CAPS: 1.0000 | ORAL_CAPSULE | Freq: Every day | ORAL | Status: DC

## 2021-03-11 MED ORDER — DEXTROSE-NACL 5-0.9 % IV SOLN: INTRAVENOUS | Status: DC

## 2021-03-11 MED ORDER — EPINEPHRINE 0.3 MG/0.3ML IJ SOAJ: 0.3000 mg | INTRAMUSCULAR | Status: DC | PRN

## 2021-03-11 MED ORDER — TRAMADOL HCL 50 MG PO TABS
50.0000 mg | ORAL_TABLET | Freq: Four times a day (QID) | ORAL | Status: DC | PRN
  Administered 2021-03-11 – 2021-03-12 (×2): 50 mg via ORAL
  Filled 2021-03-11 (×2): qty 1

## 2021-03-11 MED ORDER — AMISULPRIDE (ANTIEMETIC) 5 MG/2ML IV SOLN: 10.0000 mg | Freq: Once | INTRAVENOUS | Status: DC | PRN

## 2021-03-11 MED ORDER — TAMSULOSIN HCL 0.4 MG PO CAPS
0.4000 mg | ORAL_CAPSULE | Freq: Every day | ORAL | Status: DC
  Administered 2021-03-11 – 2021-03-12 (×2): 0.4 mg via ORAL
  Filled 2021-03-11 (×2): qty 1

## 2021-03-11 MED ORDER — CHLORHEXIDINE GLUCONATE 0.12 % MT SOLN: 15.0000 mL | Freq: Once | OROMUCOSAL | Status: AC

## 2021-03-11 MED ORDER — FLUTICASONE PROPIONATE 50 MCG/ACT NA SUSP: 1.0000 | Freq: Every day | NASAL | Status: DC | PRN

## 2021-03-11 MED ORDER — OXYCODONE HCL 5 MG/5ML PO SOLN: 5.0000 mg | Freq: Once | ORAL | Status: DC | PRN

## 2021-03-11 MED ORDER — ROCURONIUM BROMIDE 10 MG/ML (PF) SYRINGE
PREFILLED_SYRINGE | INTRAVENOUS | Status: DC | PRN
  Administered 2021-03-11: 60 mg via INTRAVENOUS

## 2021-03-11 MED ORDER — FENTANYL CITRATE (PF) 100 MCG/2ML IJ SOLN
25.0000 ug | INTRAMUSCULAR | Status: DC | PRN
  Administered 2021-03-11 (×2): 50 ug via INTRAVENOUS

## 2021-03-11 MED ORDER — METOPROLOL TARTRATE 50 MG PO TABS
50.0000 mg | ORAL_TABLET | Freq: Every day | ORAL | Status: DC
  Administered 2021-03-12: 50 mg via ORAL
  Filled 2021-03-11 (×2): qty 1

## 2021-03-11 MED ORDER — SERTRALINE HCL 50 MG PO TABS
25.0000 mg | ORAL_TABLET | Freq: Every day | ORAL | Status: DC
  Administered 2021-03-11 – 2021-03-12 (×2): 25 mg via ORAL
  Filled 2021-03-11 (×2): qty 1

## 2021-03-11 MED ORDER — CHLORHEXIDINE GLUCONATE 0.12 % MT SOLN
OROMUCOSAL | Status: AC
  Administered 2021-03-11: 15 mL via OROMUCOSAL
  Filled 2021-03-11: qty 15

## 2021-03-11 MED ORDER — IRBESARTAN 300 MG PO TABS
300.0000 mg | ORAL_TABLET | Freq: Every day | ORAL | Status: DC
  Administered 2021-03-11 – 2021-03-12 (×2): 300 mg via ORAL
  Filled 2021-03-11 (×2): qty 1

## 2021-03-11 MED ORDER — PROMETHAZINE HCL 25 MG PO TABS: 25.0000 mg | ORAL_TABLET | Freq: Four times a day (QID) | ORAL | Status: DC | PRN

## 2021-03-11 MED ORDER — OXYMETAZOLINE HCL 0.05 % NA SOLN
NASAL | Status: AC
  Filled 2021-03-11: qty 30

## 2021-03-11 MED ORDER — ONDANSETRON HCL 4 MG/2ML IJ SOLN
INTRAMUSCULAR | Status: DC | PRN
  Administered 2021-03-11: 4 mg via INTRAVENOUS

## 2021-03-11 MED ORDER — TELMISARTAN-HCTZ 80-25 MG PO TABS: 1.0000 | ORAL_TABLET | Freq: Every day | ORAL | Status: DC

## 2021-03-11 MED ORDER — OXYCODONE HCL 5 MG PO TABS: 5.0000 mg | ORAL_TABLET | Freq: Once | ORAL | Status: DC | PRN

## 2021-03-11 MED ORDER — DULAGLUTIDE 1.5 MG/0.5ML ~~LOC~~ SOAJ: 1.5000 mg | SUBCUTANEOUS | Status: DC

## 2021-03-11 MED ORDER — FENTANYL CITRATE (PF) 100 MCG/2ML IJ SOLN
INTRAMUSCULAR | Status: AC
  Filled 2021-03-11: qty 2

## 2021-03-11 MED ORDER — ELDERBERRY 575 MG/5ML PO SYRP: ORAL_SOLUTION | Freq: Every day | ORAL | Status: DC

## 2021-03-11 MED ORDER — SUGAMMADEX SODIUM 200 MG/2ML IV SOLN
INTRAVENOUS | Status: DC | PRN
  Administered 2021-03-11: 200 mg via INTRAVENOUS

## 2021-03-11 MED ORDER — PHENYLEPHRINE 40 MCG/ML (10ML) SYRINGE FOR IV PUSH (FOR BLOOD PRESSURE SUPPORT)
PREFILLED_SYRINGE | INTRAVENOUS | Status: DC | PRN
  Administered 2021-03-11: 80 ug via INTRAVENOUS

## 2021-03-11 MED ORDER — LIDOCAINE-EPINEPHRINE 1 %-1:100000 IJ SOLN
INTRAMUSCULAR | Status: AC
  Filled 2021-03-11: qty 1

## 2021-03-11 MED ORDER — ONDANSETRON HCL 4 MG/2ML IJ SOLN: 4.0000 mg | Freq: Once | INTRAMUSCULAR | Status: DC | PRN

## 2021-03-11 MED ORDER — CLINDAMYCIN PALMITATE HCL 75 MG/5ML PO SOLR
300.0000 mg | Freq: Three times a day (TID) | ORAL | Status: DC
  Administered 2021-03-11 – 2021-03-12 (×2): 300 mg via ORAL
  Filled 2021-03-11 (×5): qty 20

## 2021-03-11 MED ORDER — ASPIRIN EC 81 MG PO TBEC
81.0000 mg | DELAYED_RELEASE_TABLET | Freq: Every day | ORAL | Status: DC
  Administered 2021-03-12: 81 mg via ORAL
  Filled 2021-03-11 (×2): qty 1

## 2021-03-11 MED ORDER — HYDROCODONE-ACETAMINOPHEN 7.5-325 MG/15ML PO SOLN
10.0000 mL | ORAL | Status: DC | PRN
  Administered 2021-03-11: 15 mL via ORAL
  Filled 2021-03-11: qty 15

## 2021-03-11 MED ORDER — MIDAZOLAM HCL 2 MG/2ML IJ SOLN
INTRAMUSCULAR | Status: DC | PRN
  Administered 2021-03-11: 2 mg via INTRAVENOUS

## 2021-03-11 MED ORDER — PROPOFOL 10 MG/ML IV BOLUS
INTRAVENOUS | Status: DC | PRN
  Administered 2021-03-11: 200 mg via INTRAVENOUS

## 2021-03-11 MED ORDER — VITAMIN D 25 MCG (1000 UNIT) PO TABS
5000.0000 [IU] | ORAL_TABLET | Freq: Every day | ORAL | Status: DC
  Administered 2021-03-11 – 2021-03-12 (×2): 5000 [IU] via ORAL
  Filled 2021-03-11 (×2): qty 5

## 2021-03-11 MED ORDER — DIPHENHYDRAMINE HCL 25 MG PO CAPS
25.0000 mg | ORAL_CAPSULE | Freq: Every day | ORAL | Status: DC
  Administered 2021-03-11: 25 mg via ORAL
  Filled 2021-03-11: qty 1

## 2021-03-11 MED ORDER — LIDOCAINE 2% (20 MG/ML) 5 ML SYRINGE
INTRAMUSCULAR | Status: DC | PRN
  Administered 2021-03-11: 100 mg via INTRAVENOUS

## 2021-03-11 MED ORDER — ADULT MULTIVITAMIN W/MINERALS CH
1.0000 | ORAL_TABLET | Freq: Every day | ORAL | Status: DC
  Administered 2021-03-11 – 2021-03-12 (×2): 1 via ORAL
  Filled 2021-03-11 (×5): qty 1

## 2021-03-11 MED ORDER — ZINC SULFATE 220 (50 ZN) MG PO CAPS
220.0000 mg | ORAL_CAPSULE | Freq: Every day | ORAL | Status: DC
  Administered 2021-03-11 – 2021-03-12 (×2): 220 mg via ORAL
  Filled 2021-03-11 (×2): qty 1

## 2021-03-11 MED ORDER — ACETAMINOPHEN 500 MG PO TABS
500.0000 mg | ORAL_TABLET | Freq: Every day | ORAL | Status: DC
  Administered 2021-03-11: 500 mg via ORAL
  Filled 2021-03-11: qty 1

## 2021-03-11 MED ORDER — FENTANYL CITRATE (PF) 250 MCG/5ML IJ SOLN
INTRAMUSCULAR | Status: DC | PRN
  Administered 2021-03-11 (×4): 50 ug via INTRAVENOUS

## 2021-03-11 MED ORDER — ROSUVASTATIN CALCIUM 5 MG PO TABS
10.0000 mg | ORAL_TABLET | ORAL | Status: DC
  Administered 2021-03-11: 10 mg via ORAL
  Filled 2021-03-11: qty 2

## 2021-03-11 MED ORDER — ORAL CARE MOUTH RINSE: 15.0000 mL | Freq: Once | OROMUCOSAL | Status: AC

## 2021-03-11 MED ORDER — FENTANYL CITRATE (PF) 250 MCG/5ML IJ SOLN
INTRAMUSCULAR | Status: AC
  Filled 2021-03-11: qty 5

## 2021-03-11 MED ORDER — MIDAZOLAM HCL 2 MG/2ML IJ SOLN
INTRAMUSCULAR | Status: AC
  Filled 2021-03-11: qty 2

## 2021-03-11 MED ORDER — IBUPROFEN 100 MG/5ML PO SUSP: 400.0000 mg | Freq: Four times a day (QID) | ORAL | Status: DC | PRN

## 2021-03-11 MED ORDER — DEXAMETHASONE SODIUM PHOSPHATE 10 MG/ML IJ SOLN
INTRAMUSCULAR | Status: DC | PRN
  Administered 2021-03-11: 5 mg via INTRAVENOUS

## 2021-03-11 MED ORDER — LIDOCAINE-EPINEPHRINE 1 %-1:100000 IJ SOLN
INTRAMUSCULAR | Status: DC | PRN
  Administered 2021-03-11: 20 mL

## 2021-03-11 MED ORDER — BACITRACIN ZINC 500 UNIT/GM EX OINT
TOPICAL_OINTMENT | CUTANEOUS | Status: AC
  Filled 2021-03-11: qty 28.35

## 2021-03-11 MED ORDER — PANTOPRAZOLE SODIUM 40 MG PO TBEC
40.0000 mg | DELAYED_RELEASE_TABLET | Freq: Every day | ORAL | Status: DC
  Administered 2021-03-12: 40 mg via ORAL
  Filled 2021-03-11 (×2): qty 1

## 2021-03-11 MED ORDER — DIPHENHYDRAMINE-APAP (SLEEP) 25-500 MG PO TABS: 1.0000 | ORAL_TABLET | Freq: Every day | ORAL | Status: DC

## 2021-03-11 MED ORDER — LACTATED RINGERS IV SOLN: INTRAVENOUS | Status: DC

## 2021-03-11 MED ORDER — PROMETHAZINE HCL 25 MG RE SUPP
25.0000 mg | Freq: Four times a day (QID) | RECTAL | Status: DC | PRN
Start: 2021-03-11 — End: 2021-03-12
  Filled 2021-03-11: qty 1

## 2021-03-11 SURGICAL SUPPLY — 50 items
APPLIER CLIP 9.375 MED OPEN (MISCELLANEOUS)
APR CLP MED 9.3 20 MLT OPN (MISCELLANEOUS)
ATTRACTOMAT 16X20 MAGNETIC DRP (DRAPES) IMPLANT
BLADE SURG 15 STRL LF DISP TIS (BLADE) IMPLANT
BLADE SURG 15 STRL SS (BLADE)
CABLE BIPOLOR RESECTION CORD (MISCELLANEOUS) ×1 IMPLANT
CANISTER SUCT 3000ML PPV (MISCELLANEOUS) ×2 IMPLANT
CLEANER TIP ELECTROSURG 2X2 (MISCELLANEOUS) ×2 IMPLANT
CLIP APPLIE 9.375 MED OPEN (MISCELLANEOUS) IMPLANT
CNTNR URN SCR LID CUP LEK RST (MISCELLANEOUS) ×1 IMPLANT
CONT SPEC 4OZ STRL OR WHT (MISCELLANEOUS) ×2
COVER SURGICAL LIGHT HANDLE (MISCELLANEOUS) ×2 IMPLANT
COVER WAND RF STERILE (DRAPES) ×2 IMPLANT
DRAPE HALF SHEET 40X57 (DRAPES) ×4 IMPLANT
ELECT COATED BLADE 2.86 ST (ELECTRODE) ×2 IMPLANT
ELECT REM PT RETURN 9FT ADLT (ELECTROSURGICAL) ×2
ELECTRODE REM PT RTRN 9FT ADLT (ELECTROSURGICAL) ×1 IMPLANT
FORCEPS BIPOLAR SPETZLER 8 1.0 (NEUROSURGERY SUPPLIES) ×1 IMPLANT
GAUZE 4X4 16PLY RFD (DISPOSABLE) ×2 IMPLANT
GAUZE SPONGE 4X4 12PLY STRL (GAUZE/BANDAGES/DRESSINGS) IMPLANT
GLOVE ECLIPSE 7.5 STRL STRAW (GLOVE) ×2 IMPLANT
GOWN STRL REUS W/ TWL LRG LVL3 (GOWN DISPOSABLE) ×2 IMPLANT
GOWN STRL REUS W/TWL LRG LVL3 (GOWN DISPOSABLE) ×4
KIT BASIN OR (CUSTOM PROCEDURE TRAY) ×2 IMPLANT
KIT TURNOVER KIT B (KITS) ×2 IMPLANT
LOCATOR NERVE 3 VOLT (DISPOSABLE) IMPLANT
NDL PRECISIONGLIDE 27X1.5 (NEEDLE) ×1 IMPLANT
NEEDLE PRECISIONGLIDE 27X1.5 (NEEDLE) ×2 IMPLANT
NS IRRIG 1000ML POUR BTL (IV SOLUTION) ×2 IMPLANT
PAD ARMBOARD 7.5X6 YLW CONV (MISCELLANEOUS) ×2 IMPLANT
PENCIL FOOT CONTROL (ELECTRODE) ×2 IMPLANT
POSITIONER HEAD DONUT 9IN (MISCELLANEOUS) ×2 IMPLANT
SPONGE INTESTINAL PEANUT (DISPOSABLE) IMPLANT
SPONGE LAP 18X18 RF (DISPOSABLE) ×2 IMPLANT
SURGILUBE 2OZ TUBE FLIPTOP (MISCELLANEOUS) IMPLANT
SUT CHROMIC 3 0 PS 2 (SUTURE) ×6 IMPLANT
SUT SILK 0 FSL (SUTURE) ×2 IMPLANT
SUT SILK 2 0 PERMA HAND 18 BK (SUTURE) IMPLANT
SUT SILK 3 0 REEL (SUTURE) ×1 IMPLANT
SUT SILK 3 0 SH CR/8 (SUTURE) ×1 IMPLANT
SUT SILK 4 0 REEL (SUTURE) ×2 IMPLANT
SUT VIC AB 3-0 SH 27 (SUTURE) ×6
SUT VIC AB 3-0 SH 27XBRD (SUTURE) ×2 IMPLANT
SUT VIC AB 4-0 RB1 18 (SUTURE) IMPLANT
TOWEL GREEN STERILE FF (TOWEL DISPOSABLE) ×2 IMPLANT
TRAY ENT MC OR (CUSTOM PROCEDURE TRAY) ×2 IMPLANT
TRAY FOLEY MTR SLVR 14FR STAT (SET/KITS/TRAYS/PACK) IMPLANT
TUBE CONNECTING 12X1/4 (SUCTIONS) ×2 IMPLANT
UNDERPAD 30X36 HEAVY ABSORB (UNDERPADS AND DIAPERS) ×2 IMPLANT
WATER STERILE IRR 1000ML POUR (IV SOLUTION) ×2 IMPLANT

## 2021-03-11 NOTE — Op Note (Signed)
OPERATIVE REPORT  DATE OF SURGERY: 03/11/2021  PATIENT:  [name] [name],  65 y.o. male  PRE-OPERATIVE DIAGNOSIS:  Tongue cancer  POST-OPERATIVE DIAGNOSIS:  Tongue cancer  PROCEDURE:  Procedure(s): PARTIAL GLOSSECTOMY  SURGEON:  Beckie Salts, MD  ASSISTANTS: RNFA  ANESTHESIA:   General   EBL: 50 ml  DRAINS: None  LOCAL MEDICATIONS USED:  None  SPECIMEN: Right tongue partial glossectomy, double suture marks anterior, single suture marks superior.  Frozen section analysis margins clear, closest margin anterior margin at 8 mm.  COUNTS:  Correct  PROCEDURE DETAILS: The patient was taken to the operating room and placed on the operating table in the supine position. Following induction of general endotracheal anesthesia, the face was prepped and draped in a standard fashion.  A towel clip was used to retract the tip of the tongue forward.  The lesion was identified on the right and electrocautery used to mark the mucosa around it allowing for approximately 1 cm margins.  Electrocautery dissection was completed.  Entire lesion was removed oriented and sent for frozen section analysis.  4-0 silk ties were used on branch of the lingual artery.  Remaining hemostasis was accomplished with bipolar cautery.  The defect was then closed using interrupted inverted 3-0 Vicryl on the deep muscular layer and running 3-0 Vicryl on the mucosa.  Pharynx was suctioned blood and secretions.  Patient was awakened extubated and transferred to recovery in stable condition.    PATIENT DISPOSITION:  To PACU, stable

## 2021-03-11 NOTE — Anesthesia Procedure Notes (Signed)
Procedure Name: Intubation Date/Time: 03/11/2021 12:28 PM Performed by: Dorthea Cove, CRNA Pre-anesthesia Checklist: Patient identified, Emergency Drugs available, Suction available and Patient being monitored Patient Re-evaluated:Patient Re-evaluated prior to induction Oxygen Delivery Method: Circle System Utilized Preoxygenation: Pre-oxygenation with 100% oxygen Induction Type: IV induction Ventilation: Mask ventilation without difficulty Laryngoscope Size: Mac and 4 Grade View: Grade II Tube type: Oral Tube size: 7.5 mm Number of attempts: 1 Airway Equipment and Method: Stylet and Oral airway Placement Confirmation: ETT inserted through vocal cords under direct vision,  positive ETCO2 and breath sounds checked- equal and bilateral Secured at: 23 cm Tube secured with: Tape Dental Injury: Teeth and Oropharynx as per pre-operative assessment

## 2021-03-11 NOTE — Transfer of Care (Signed)
Immediate Anesthesia Transfer of Care Note  Patient: [name] [name]  Procedure(s) Performed: PARTIAL GLOSSECTOMY (Right Mouth)  Patient Location: PACU  Anesthesia Type:General  Level of Consciousness: awake, alert  and oriented  Airway & Oxygen Therapy: Patient Spontanous Breathing  Post-op Assessment: Report given to RN and Post -op Vital signs reviewed and stable  Post vital signs: Reviewed and stable  Last Vitals:  Vitals Value Taken Time  BP 126/83 03/11/21 1324  Temp    Pulse 77 03/11/21 1326  Resp 24 03/11/21 1326  SpO2 97 % 03/11/21 1326  Vitals shown include unvalidated device data.  Last Pain:  Vitals:   03/11/21 1033  TempSrc:   PainSc: 0-No pain      Patients Stated Pain Goal: 3 (70/01/74 9449)  Complications: No complications documented.

## 2021-03-11 NOTE — Progress Notes (Signed)
ENT Post Operative Note  Subjective: Patient seen and examined, wife at bedside.  Endorses tongue pain and odynophagia, improved with pain medication.  He is tolerating a clear liquid diet.  Denies nausea, emesis.  Vitals:   03/11/21 1500 03/11/21 1523  BP:  (!) 148/74  Pulse: 76 84  Resp: 20 20  Temp: 98.2 F (36.8 C) 98.3 F (36.8 C)  SpO2: 99% 100%    OBJECTIVE  Gen: alert, cooperative, appropriate Head/ENT: EOMI, neck supple, suture line along right tongue intact, with mild edema, no evidence of hematoma or active bleeding.  Mucus membranes moist and pink, conjunctiva clear Face moves symmetrically Respiratory: Voice without dysphonia. non-labored breathing, no accessory muscle use, normal HR, good O2 saturations  ASSESS/ PLAN  Lavalle Skoda is a 65 y.o. male who is POD 0 from right partial glossectomy.  -Pain control with PRN Ibuprofen, Ultram, Hycet -Continue home meds -Continue thin liquid diet -Continue Cleocin -Continue to monitor   Please do not hesitate to contact me with any questions or concerns.   Jason Coop, [place] [facility] Cell: [phone]

## 2021-03-11 NOTE — Anesthesia Postprocedure Evaluation (Signed)
Anesthesia Post Note  Patient: [name] [name]  Procedure(s) Performed: PARTIAL GLOSSECTOMY (Right Mouth)     Patient location during evaluation: PACU Anesthesia Type: General Level of consciousness: awake and alert Pain management: pain level controlled Vital Signs Assessment: post-procedure vital signs reviewed and stable Respiratory status: spontaneous breathing, nonlabored ventilation and respiratory function stable Cardiovascular status: blood pressure returned to baseline and stable Postop Assessment: no apparent nausea or vomiting Anesthetic complications: no   No complications documented.  Last Vitals:  Vitals:   03/11/21 1454 03/11/21 1500  BP: (!) 149/71   Pulse: 76 76  Resp: 20 20  Temp:  36.8 C  SpO2: 99% 99%    Last Pain:  Vitals:   03/11/21 1033  TempSrc:   PainSc: 0-No pain                 Lidia Collum

## 2021-03-11 NOTE — Interval H&P Note (Signed)
History and Physical Interval Note:  03/11/2021 11:06 AM  [name] [name]  has presented today for surgery, with the diagnosis of Tongue cancer.  The various methods of treatment have been discussed with the patient and family. After consideration of risks, benefits and other options for treatment, the patient has consented to  Procedure(s): PARTIAL GLOSSECTOMY (N/A) as a surgical intervention.  The patient's history has been reviewed, patient examined, no change in status, stable for surgery.  I have reviewed the patient's chart and labs.  Questions were answered to the patient's satisfaction.     [name]

## 2021-03-11 NOTE — Plan of Care (Signed)

## 2021-03-12 DIAGNOSIS — C029 Malignant neoplasm of tongue, unspecified: Secondary | ICD-10-CM | POA: Diagnosis not present

## 2021-03-12 LAB — GLUCOSE, CAPILLARY: Glucose-Capillary: 166 mg/dL — ABNORMAL HIGH (ref 70–99)

## 2021-03-12 MED ORDER — HYDROCODONE-ACETAMINOPHEN 7.5-325 MG PO TABS: 1.0000 | ORAL_TABLET | Freq: Four times a day (QID) | ORAL | 0 refills | Status: DC | PRN

## 2021-03-12 MED ORDER — PROMETHAZINE HCL 25 MG RE SUPP: 25.0000 mg | Freq: Four times a day (QID) | RECTAL | 1 refills | Status: DC | PRN

## 2021-03-12 MED ORDER — CLINDAMYCIN HCL 300 MG PO CAPS: 300.0000 mg | ORAL_CAPSULE | Freq: Three times a day (TID) | ORAL | 0 refills | Status: DC

## 2021-03-12 NOTE — Discharge Summary (Signed)
Physician Discharge Summary  Patient ID: [name] [name] MRN: [id number] DOB/AGE: October 10, 1956 65 y.o.  Admit date: 03/11/2021 Discharge date: 03/12/2021  Admission Diagnoses: Tongue cancer  Discharge Diagnoses:  Active Problems:   Tongue cancer [facility])   Discharged Condition: good  Hospital Course: No complications  Consults: none  Significant Diagnostic Studies: none  Treatments: surgery: Partial glossectomy with primary closure  Discharge Exam: Blood pressure 123/74, pulse 91, temperature (!) 97.5 F (36.4 C), temperature source Oral, resp. rate 19, height 6' (1.829 m), weight 128.4 kg, SpO2 98 %. PHYSICAL EXAM: Awake and alert, breathing and voice are clear.  Tongue mobility is normal.  Closure is intact and there is no signs of infection  Disposition: Discharge disposition: 01-Home or Self Care       Discharge Instructions    Diet - low sodium heart healthy   Complete by: As directed    Increase activity slowly   Complete by: As directed    No wound care   Complete by: As directed      Allergies as of 03/12/2021      Reactions   Bee Venom Anaphylaxis   Bydureon [exenatide] Rash   Penicillins Hives, Rash      Medication List    TAKE these medications   aspirin EC 81 MG tablet Take 81 mg by mouth daily. Swallow whole.   clindamycin 300 MG capsule Commonly known as: Cleocin Take 1 capsule (300 mg total) by mouth 3 (three) times daily.   Dialyvite Vitamin D 5000 125 MCG (5000 UT) capsule Generic drug: Cholecalciferol Take 5,000 Units by mouth daily.   diphenhydramine-acetaminophen 25-500 MG Tabs tablet Commonly known as: TYLENOL PM Take 1 tablet by mouth at bedtime.   Dulaglutide 1.5 MG/0.5ML Sopn Inject 1.5 mg into the skin once a week.   ELDERBERRY PO Take 1 capsule by mouth daily.   EPINEPHrine 0.3 mg/0.3 mL Soaj injection Commonly known as: EPI-PEN Inject 0.3 mg into the muscle as needed for anaphylaxis.   fluticasone 50 MCG/ACT nasal  spray Commonly known as: FLONASE Place 1 spray into both nostrils daily as needed for allergies or rhinitis.   HYDROcodone-acetaminophen 7.5-325 MG tablet Commonly known as: Norco Take 1 tablet by mouth every 6 (six) hours as needed for moderate pain.   metoprolol tartrate 50 MG tablet Commonly known as: LOPRESSOR Take 1 tablet (50 mg total) by mouth 2 (two) times daily. What changed: when to take this   multivitamin tablet Take 1 tablet by mouth daily.   omeprazole 20 MG capsule Commonly known as: PRILOSEC Take 20 mg by mouth every other day.   promethazine 25 MG suppository Commonly known as: PHENERGAN Place 1 suppository (25 mg total) rectally every 6 (six) hours as needed for nausea or vomiting.   rosuvastatin 10 MG tablet Commonly known as: CRESTOR Take 10 mg by mouth every other day.   rosuvastatin 20 MG tablet Commonly known as: CRESTOR Take 0.5 tablets (10 mg total) by mouth every other day.   sertraline 50 MG tablet Commonly known as: ZOLOFT Take 25 mg by mouth daily.   tadalafil 20 MG tablet Commonly known as: CIALIS Take 20 mg by mouth daily as needed for erectile dysfunction.   tamsulosin 0.4 MG Caps capsule Commonly known as: FLOMAX Take 0.4 mg by mouth daily.   telmisartan-hydrochlorothiazide 80-25 MG tablet Commonly known as: MICARDIS HCT Take 1 tablet by mouth daily.   traMADol 50 MG tablet Commonly known as: ULTRAM Take 50 mg by mouth every 6 (  six) hours as needed for moderate pain.   Turmeric Curcumin Caps Take 1 capsule by mouth daily.   zinc gluconate 50 MG tablet Take 50 mg by mouth daily.        Signed: [name] 03/12/2021, 8:56 AM

## 2021-03-12 NOTE — Discharge Instructions (Signed)
Drink lots of fluids.  Eat what ever you are able to.  You may supplement with protein shakes or smoothies if needed.  Brush your teeth away and only do.  Rinse your mouth with salt water several times daily.

## 2021-03-13 ENCOUNTER — Other Ambulatory Visit: Payer: Medicare Other

## 2021-03-13 LAB — SURGICAL PATHOLOGY

## 2021-03-16 NOTE — Telephone Encounter (Signed)
DME waiting on the machine.

## 2021-03-23 ENCOUNTER — Ambulatory Visit (HOSPITAL_COMMUNITY)
Admission: RE | Admit: 2021-03-23 | Discharge: 2021-03-23 | Disposition: A | Discharge status: Home / Self Care | Payer: Medicare Other | Source: Ambulatory Visit | Attending: Otolaryngology | Admitting: Otolaryngology

## 2021-03-23 ENCOUNTER — Other Ambulatory Visit: Payer: Self-pay

## 2021-03-23 DIAGNOSIS — C029 Malignant neoplasm of tongue, unspecified: Secondary | ICD-10-CM | POA: Insufficient documentation

## 2021-03-23 LAB — GLUCOSE, CAPILLARY: Glucose-Capillary: 154 mg/dL — ABNORMAL HIGH (ref 70–99)

## 2021-03-23 MED ORDER — FLUDEOXYGLUCOSE F - 18 (FDG) INJECTION
14.7000 | Freq: Once | INTRAVENOUS | Status: AC | PRN
  Administered 2021-03-23: 14.2 via INTRAVENOUS

## 2021-04-17 ENCOUNTER — Ambulatory Visit: Payer: Medicare Other | Admitting: Cardiology

## 2021-04-29 ENCOUNTER — Telehealth: Payer: Self-pay | Admitting: Radiation Oncology

## 2021-04-29 NOTE — Telephone Encounter (Signed)
Dr. Isidore Moos received a phone call from Dr. Constance Holster wanting to refer patient to her. I've called Dr. [name] office at [phone] and had to leave a voicemail to [place], referral coordinator, to send [place] the referral.

## 2021-05-04 ENCOUNTER — Ambulatory Visit: Payer: Medicare Other | Admitting: Student

## 2021-05-04 ENCOUNTER — Other Ambulatory Visit: Payer: Self-pay

## 2021-05-04 ENCOUNTER — Encounter: Payer: Self-pay | Admitting: Student

## 2021-05-04 VITALS — BP 122/68 | HR 73 | Temp 98.4°F | Wt 283.0 lb

## 2021-05-04 DIAGNOSIS — I447 Left bundle-branch block, unspecified: Secondary | ICD-10-CM

## 2021-05-04 DIAGNOSIS — I10 Essential (primary) hypertension: Secondary | ICD-10-CM

## 2021-05-04 DIAGNOSIS — E7800 Pure hypercholesterolemia, unspecified: Secondary | ICD-10-CM

## 2021-05-04 MED ORDER — METOPROLOL SUCCINATE ER 25 MG PO TB24: 25.0000 mg | ORAL_TABLET | Freq: Every day | ORAL | 3 refills | Status: DC

## 2021-05-04 NOTE — Progress Notes (Signed)
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 He had a benign lesion removed from his tongue about 6 or 7 years ago. He did well until earlier this past year when he developed a sore in the right side of his tongue that has recently enlarged. He had a biopsy of that which was sent to [facility] and is consistent with squamous cell carcinoma, without HPV.  Biopsies revealed:  03/11/2021 FINAL MICROSCOPIC DIAGNOSIS:  A. TONGUE, RIGHT, PARTIAL GLOSSECTOMY:  - Invasive squamous cell carcinoma, 2.2 x 1.2 cm.  - Tumor depth of invasion is 5 mm.  - Margins of resection are not involved. Comment: Immunohistochemistry for p16 and in situ hybridization for HPV 16/18 are both negative by outside report.  Nutrition Status Yes No Comments  Weight changes? [x]  []  Reports he lost ~20 lbs unintentionally after surgery (do to decreased diet) and recovering from c.diff infection  Swallowing concerns? []  [x]    PEG? []  [x]     Referrals Yes No Comments  Social Work? [x]  []    Dentistry? [x]  []    Swallowing therapy? [x]  []    Nutrition? [x]  []    Med/Onc? []  [x]     Safety Issues Yes No Comments  Prior radiation? []  [x]    Pacemaker/ICD? []  [x]    Possible current pregnancy? []  [x]  N/A  Is the patient on methotrexate? []  [x]     Tobacco/Marijuana/Snuff/ETOH use: Patient has never smoked or used smokeless tobacco. Denies any alcohol or recreational drug use  Past/Anticipated interventions by otolaryngology, if any:  Office visit with Dr. Constance Holster (04/03/2021) "Return visit. He is here with his wife today. He is doing much better. On exam the tongue is very close to being completely healed. No signs of infection. We had a lengthy discussion again about the staging. This is a very early T2N0 tongue lesion. Standard recommendation is for staging/prophylactic neck  dissection. Risks and benefits were discussed in detail. He will think about this and will let me know."  03/11/2021 Dr. [name] PARTIAL GLOSSECTOMY  Past/Anticipated interventions by medical oncology, if any:  No referral placed at this time   Current Complaints / other details:  Patient has received the first 3 Pfizer vaccines

## 2021-05-04 NOTE — Progress Notes (Signed)
Primary Physician/Referring:  Ginger Organ., MD  Patient ID: [name] [name], male    DOB: 22-Apr-1956, 65 y.o.   MRN: [id number]  Chief Complaint  Patient presents with  . Shortness of Breath  . Hyperlipidemia  . Hypertension   HPI:    [name] [name]  is a 65 y.o. Caucasian male with hypertension, hyperlipidemia, diabetes mellitus, obstructive sleep apnea on CPAP, left bundle branch block who had seen a month ago when he presented with abnormal EKG, fatigue and left arm tingling and numbness which improved with nitroglycerin.  Patient therefore underwent nuclear stress test 10/2020 which was high risk given perfusion defect suggestive of infarct.  Patient underwent echocardiogram 12/2020 which revealed low normal LVEF at 50-55% and left bundle branch block, otherwise no significant abnormalities.  Patient now presents for 39-monthfollow-up of hyperlipidemia and hypertension.  At last visit referred patient for reevaluation of obstructive sleep apnea And increased Crestor from 10 mg to 20 mg daily.  However patient has only been taking Crestor 10 mg instead of 20 mg daily.  He is also now following with Dr. ARexene Albertsfor management of obstructive sleep apnea.  Since last visit patient was diagnosed with tongue cancer for which she underwent partial glossectomy in April.  He is establishing with Dr. SCamelia Engof oncology later this week.  Following surgical intervention patient underwent PET scan which was clean.  He does note that since undergoing cancer treatment he has had mildly worsening fatigue.  Repeat lipid profile testing did reveal triglycerides remain elevated, however LDL is under excellent control.  Despite cancer diagnosis and recent treatment, patient states he is overall feeling relatively well from a cardiovascular standpoint.  Denies chest pain, palpitations, dyspnea, dizziness, syncope, near syncope.  Denies leg swelling, orthopnea, PND.  Past Medical History:  Diagnosis Date  .  Arthritis    KNEES AND NECK; TORN MENISCUS RT KNEE  . Chest pain, atypical   . Diabetes mellitus    ORAL MEDS  . ED (erectile dysfunction)   . GERD (gastroesophageal reflux disease)   . H/O bee sting allergy   . History of kidney stones   . Hyperlipidemia   . Hypertension   . LBBB (left bundle branch block)   . Lichen planus    OF MOUTH  . Obesity   . OSA (obstructive sleep apnea)    USES CPAP - DOES NOT KNOW SETTING  . Sleep apnea    wears CPAP   Past Surgical History:  Procedure Laterality Date  . ANAL FISSURE REPAIR    . CARDIAC CATHETERIZATION N/A 04/25/2015   Procedure: Left Heart Cath and Coronary Angiography;  Surgeon: Peter M JMartinique MD;  [facility];  Service: Cardiovascular;  Laterality: N/A;  . CARDIOVASCULAR STRESS TEST  01/16/2010   EF 54%  . COLONOSCOPY    . KNEE ARTHROSCOPY Right 05/10/2014   Procedure: RIGHT KNEE ARTHROSCOPY WITH PARTIAL MEDIAL MENISCECTOMY;  Surgeon: CMcarthur Rossetti MD;  Location: [facility];  Service: Orthopedics;  Laterality: Right;  . SINUS SURGERY WITH INSTATRAK    . [place]OCARDIOGRAPHY  09/19/2007   EF 55-60%  . VASECTOMY     Family History  Problem Relation Age of Onset  . Hypertension Mother   . Heart disease Mother   . Rheum arthritis Mother   . Kidney failure Father   . Kidney disease Father   . Heart disease Father   . Colon cancer Neg Hx   . Esophageal cancer Neg Hx   .  Stomach cancer Neg Hx   . Rectal cancer Neg Hx     Social History   Tobacco Use  . Smoking status: Never Smoker  . Smokeless tobacco: Never Used  Substance Use Topics  . Alcohol use: No    Alcohol/week: 0.0 standard drinks   Marital Status: Married  ROS  Review of Systems  Constitutional: Positive for malaise/fatigue. Negative for weight gain.  Cardiovascular: Positive for dyspnea on exertion (improved). Negative for chest pain, claudication, leg swelling, near-syncope, orthopnea, palpitations, paroxysmal nocturnal dyspnea and  syncope.  Respiratory: Positive for snoring. Negative for shortness of breath.   Hematologic/Lymphatic: Does not bruise/bleed easily.  Musculoskeletal: Positive for joint pain and neck pain.  Gastrointestinal: Negative for melena.  Neurological: Negative for dizziness, light-headedness and weakness.  All other systems reviewed and are negative.  Objective  Blood pressure 122/68, pulse 73, temperature 98.4 F (36.9 C), weight 283 lb (128.4 kg), SpO2 96 %.  Vitals with BMI 05/05/2021 05/04/2021 03/12/2021  Height _0  - -  Weight 283 lbs 3 oz 283 lbs -  BMI 17.7 - -  Systolic 116 579 038  Diastolic 86 68 74  Pulse 77 73 91     Physical Exam Constitutional:      General: He is not in acute distress.    Appearance: He is obese.     Comments: Morbidly obese in no acute distress.  Cardiovascular:     Rate and Rhythm: Normal rate and regular rhythm.     Pulses:          Carotid pulses are 2+ on the right side and 2+ on the left side.      Popliteal pulses are 2+ on the right side and 2+ on the left side.       Dorsalis pedis pulses are 2+ on the right side and 2+ on the left side.       Posterior tibial pulses are 2+ on the right side and 2+ on the left side.     Heart sounds: Normal heart sounds. No murmur heard. No gallop.      Comments: Femoral pulse difficult to feel due to patient's body habitus.  No leg edema. JVD difficult to see due to short neck. Pulmonary:     Effort: Pulmonary effort is normal.     Breath sounds: Normal breath sounds.  Abdominal:     Comments: Obese. Pannus present  Neurological:     Mental Status: He is alert.    Laboratory examination:   CMP Latest Ref Rng & Units 03/11/2021 04/21/2015 05/07/2014  Glucose 70 - 99 mg/dL 138(H) 160(H) 214(H)  BUN 8 - 23 mg/dL _1 Creatinine 0.61 - 1.24 mg/dL 0.88 0.75 0.73  Sodium 135 - 145 mmol/L 137 138 137  Potassium 3.5 - 5.1 mmol/L 3.8 4.5 4.3  Chloride 98 - 111 mmol/L 102 99 97  CO2 22 - 32 mmol/L _2 Calcium 8.9 - 10.3 mg/dL 9.5 9.4 9.5   CBC Latest Ref Rng & Units 03/11/2021 04/21/2015 05/07/2014  WBC 4.0 - 10.5 K/uL 5.6 5.8 6.3  Hemoglobin 13.0 - 17.0 g/dL 14.6 15.5 14.3  Hematocrit 39.0 - 52.0 % 42.8 45.7 40.9  Platelets 150 - 400 K/uL 227 252 234   Lipid Panel  No results found for: CHOL, TRIG, HDL, CHOLHDL, VLDL, LDLCALC, LDLDIRECT HEMOGLOBIN A1C No results found for: HGBA1C, MPG TSH No results for input(s): TSH in the last 8760 hours.  External labs:  04/20/2021:  Glucose 170, BUN 13, creatinine 0.80, GFR 96.4, sodium 131, potassium 3.7, AST 17, ALT 29, alk phos 95 Hemoglobin 14.8, hematocrit 43.8, MCV 89.6, platelet 274  03/19/2021: Total cholesterol 135, triglycerides 196, HDL 33, LDL 63 TSH 1.61 A1c 6.7%.  09/09/2020: Total cholesterol 180, HDL 42, LDL 116, triglycerides 140 A1c 6.3%  Allergies   Allergies  Allergen Reactions  . Bee Venom Anaphylaxis  . Ezetimibe     Other reaction(s): Unknown  . Insulin Degludec-Liraglutide     Other reaction(s): local skin reaction  . Other     Other reaction(s): myalgia  . Bydureon [Exenatide] Rash  . Penicillins Hives and Rash      Medications Prior to Visit:   Outpatient Medications Prior to Visit  Medication Sig Dispense Refill  . aspirin EC 81 MG tablet Take 81 mg by mouth daily. Swallow whole.    . Cholecalciferol (DIALYVITE VITAMIN D 5000) 125 MCG (5000 UT) capsule Take 5,000 Units by mouth daily.    . diphenhydramine-acetaminophen (TYLENOL PM EXTRA STRENGTH) 25-500 MG TABS tablet 1 tablet at bedtime as needed    . Dulaglutide 1.5 MG/0.5ML SOPN Inject 1.5 mg into the skin once a week.    Marland Kitchen ELDERBERRY PO Take 1 capsule by mouth daily.    Marland Kitchen EPINEPHrine 0.3 mg/0.3 mL IJ SOAJ injection Inject 0.3 mg into the muscle as needed for anaphylaxis.    Marland Kitchen metroNIDAZOLE (FLAGYL) 500 MG tablet Take 500 mg by mouth 3 (three) times daily.    . Multiple Vitamin (MULTIVITAMIN) tablet Take 1 tablet by mouth daily.    Marland Kitchen  omeprazole (PRILOSEC) 20 MG capsule Take 20 mg by mouth every other day.    . rosuvastatin (CRESTOR) 10 MG tablet Take 10 mg by mouth every other day.    . sertraline (ZOLOFT) 50 MG tablet Take 25 mg by mouth daily.    . tadalafil (CIALIS) 20 MG tablet Take 20 mg by mouth daily as needed for erectile dysfunction.    . tamsulosin (FLOMAX) 0.4 MG CAPS capsule Take 0.4 mg by mouth daily.    Marland Kitchen telmisartan-hydrochlorothiazide (MICARDIS HCT) 80-25 MG tablet Take 1 tablet by mouth daily.    . traMADol (ULTRAM) 50 MG tablet Take 50 mg by mouth every 6 (six) hours as needed for moderate pain.    Marland Kitchen zinc gluconate 50 MG tablet Take 50 mg by mouth daily.    Marland Kitchen EPINEPHrine (EPIPEN 2-PAK) 0.3 mg/0.3 mL IJ SOAJ injection See admin instructions.    . metoprolol tartrate (LOPRESSOR) 50 MG tablet Take 1 tablet (50 mg total) by mouth 2 (two) times daily. (Patient taking differently: Take 50 mg by mouth daily.) 180 tablet 3  . Misc Natural Products (TURMERIC CURCUMIN) CAPS Take 1 capsule by mouth daily. (Patient not taking: Reported on 05/04/2021)    . clindamycin (CLEOCIN) 300 MG capsule Take 1 capsule (300 mg total) by mouth 3 (three) times daily. 15 capsule 0  . diphenhydramine-acetaminophen (TYLENOL PM) 25-500 MG TABS tablet Take 1 tablet by mouth at bedtime.    . fluticasone (FLONASE) 50 MCG/ACT nasal spray Place 1 spray into both nostrils daily as needed for allergies or rhinitis.    Marland Kitchen HYDROcodone-acetaminophen (NORCO) 7.5-325 MG tablet Take 1 tablet by mouth every 6 (six) hours as needed for moderate pain. 20 tablet 0  . promethazine (PHENERGAN) 25 MG suppository Place 1 suppository (25 mg total) rectally every 6 (six) hours as needed for nausea or vomiting. 12 suppository 1  . rosuvastatin (  CRESTOR) 20 MG tablet Take 0.5 tablets (10 mg total) by mouth every other day. (Patient not taking: No sig reported) 90 tablet 3   No facility-administered medications prior to visit.     Final Medications at End of Visit     Current Meds  Medication Sig  . aspirin EC 81 MG tablet Take 81 mg by mouth daily. Swallow whole.  . Cholecalciferol (DIALYVITE VITAMIN D 5000) 125 MCG (5000 UT) capsule Take 5,000 Units by mouth daily.  . diphenhydramine-acetaminophen (TYLENOL PM EXTRA STRENGTH) 25-500 MG TABS tablet 1 tablet at bedtime as needed  . Dulaglutide 1.5 MG/0.5ML SOPN Inject 1.5 mg into the skin once a week.  Marland Kitchen ELDERBERRY PO Take 1 capsule by mouth daily.  Marland Kitchen EPINEPHrine 0.3 mg/0.3 mL IJ SOAJ injection Inject 0.3 mg into the muscle as needed for anaphylaxis.  . metoprolol succinate (TOPROL-XL) 25 MG 24 hr tablet Take 1 tablet (25 mg total) by mouth daily. (Patient taking differently: Take 50 mg by mouth daily.)  . metroNIDAZOLE (FLAGYL) 500 MG tablet Take 500 mg by mouth 3 (three) times daily.  . Multiple Vitamin (MULTIVITAMIN) tablet Take 1 tablet by mouth daily.  Marland Kitchen omeprazole (PRILOSEC) 20 MG capsule Take 20 mg by mouth every other day.  . rosuvastatin (CRESTOR) 10 MG tablet Take 10 mg by mouth every other day.  . sertraline (ZOLOFT) 50 MG tablet Take 25 mg by mouth daily.  . tadalafil (CIALIS) 20 MG tablet Take 20 mg by mouth daily as needed for erectile dysfunction.  . tamsulosin (FLOMAX) 0.4 MG CAPS capsule Take 0.4 mg by mouth daily.  Marland Kitchen telmisartan-hydrochlorothiazide (MICARDIS HCT) 80-25 MG tablet Take 1 tablet by mouth daily.  . traMADol (ULTRAM) 50 MG tablet Take 50 mg by mouth every 6 (six) hours as needed for moderate pain.  Marland Kitchen zinc gluconate 50 MG tablet Take 50 mg by mouth daily.  . [DISCONTINUED] EPINEPHrine (EPIPEN 2-PAK) 0.3 mg/0.3 mL IJ SOAJ injection See admin instructions.  . [DISCONTINUED] metoprolol tartrate (LOPRESSOR) 50 MG tablet Take 1 tablet (50 mg total) by mouth 2 (two) times daily. (Patient taking differently: Take 50 mg by mouth daily.)   Radiology:   Chest x-ray 04/22/2015: The lungs are adequately inflated and clear. The heart is top-normal in size but stable. The pulmonary  vascularity is within the limits of normal. There is no pleural effusion. The mediastinum is normal in width. The bony thorax exhibits no acute abnormality.  IMPRESSION: There is no active cardiopulmonary disease.  Cardiac Studies:   Myocardial perfusion scan 04/17/2015: Myocardial perfusion is abnormal. Moderate sized and intensity, reversible anterior, anteroseptal, septal and apical defect. Findings consistent with ischemia. LBBB is present and can also lead to septal defect, but this is usually a fixed defect. Ischemia is favored. This is an intermediate risk study. Overall left ventricular systolic function was abnormal. LV cavity size is normal. The left ventricular ejection fraction is mildly decreased (50%). There is no prior study for comparison.  Cardiac catheterization 04/25/2015: Right dominant circulation, normal coronary arteries.  Normal LV systolic function, EF 55 to 65%.    Lexiscan Tetrofosmin stress test 11/24/2020: Lexiscan nuclear stress test performed using 1-day protocol. SPECT images show large sized severe intensity, predominantly fixed perfusion defect in apical to basal inferior/inferolateral, and basal inferoseptal myocardium, with corresponding akinesis suggestive of infarct. Stress LVEF 34%. High risk study.   Compared to 04/17/2015, anterior ischemia was noted previously, patient does have a history of normal coronary angiography in 2016.  Echocardiogram  01/03/2021: Study Quality: Technically difficult study. Normal LV systolic function with visual EF 50-55%. Abnormal septal wall motion due to left bundle branch block. Left ventricle cavity is normal in size. Normal global wall motion. Normal diastolic filling pattern, normal LAP.  No significant valvular heart disease. No prior study for comparison.   EKG:   05/04/2021: Sinus rhythm at a rate of 74 bpm.  Left bundle branch block, no further analysis.  EKG 11/18/2020: Sinus tachycardia at rate of 100 bpm,  left atrial enlargement, left bundle branch block.  No further analysis.  No change compared to 11/14/2020.  No change in left bundle branch block, compared to 03/27/2014.  Heart rate was 90 bpm.  Assessment     ICD-10-CM   1. Essential hypertension  I10 EKG 12-Lead  2. Hypercholesteremia  E78.00   3. LBBB (left bundle branch block)  I44.7 EKG 12-Lead    Medications Discontinued During This Encounter  Medication Reason  . rosuvastatin (CRESTOR) 20 MG tablet Error  . promethazine (PHENERGAN) 25 MG suppository Error  . fluticasone (FLONASE) 50 MCG/ACT nasal spray Error  . HYDROcodone-acetaminophen (NORCO) 7.5-325 MG tablet Error  . EPINEPHrine (EPIPEN 2-PAK) 0.3 mg/0.3 mL IJ SOAJ injection Error  . clindamycin (CLEOCIN) 300 MG capsule Error  . diphenhydramine-acetaminophen (TYLENOL PM) 25-500 MG TABS tablet Error  . metoprolol tartrate (LOPRESSOR) 50 MG tablet Change in therapy    Meds ordered this encounter  Medications  . metoprolol succinate (TOPROL-XL) 25 MG 24 hr tablet    Sig: Take 1 tablet (25 mg total) by mouth daily.    Dispense:  30 tablet    Refill:  3   Orders Placed This Encounter  Procedures  . EKG 12-Lead    Recommendations:   [name] [name] is a 65 y.o. Caucasian male with hypertension, hyperlipidemia, diabetes mellitus, obstructive sleep apnea on CPAP, left bundle branch block who had seen a month ago when he presented with abnormal EKG, fatigue and left arm tingling and numbness which improved with nitroglycerin. Patient therefore underwent nuclear stress test 10/2020 which was high risk given perfusion defect suggestive of infarct.  Patient underwent echocardiogram 12/2020 which revealed low normal LVEF at 50-55% and left bundle branch block, otherwise no significant abnormalities.  Patient now presents for 50-monthfollow-up of hyperlipidemia and hypertension.  At last visit referred patient for reevaluation of obstructive sleep apnea And increased Crestor from 10 mg  to 20 mg daily.  Patient has only been taking Crestor 10 mg daily instead however.  Patient's LDL is under excellent control, however triglycerides remain elevated.  Discussed with patient at length regarding diet and lifestyle modifications.  Also discussed option of increasing Crestor to 20 mg as previously prescribed.  However patient reports he is under significant stress given recent cancer diagnosis and pending treatment discussion.  He would like to hold off on further medication adjustments at this time.  In regard to hypertension, patient's blood pressure is well controlled.  Patient does however admit that he has been skipping his second dose of metoprolol tartrate due to occasional episodes of positional dizziness.  He also states it is difficult for him to remember to take metoprolol twice daily.  We will therefore switch her from metoprolol to tartrate to metoprolol succinate 25 mg once daily.  Patient will monitor his blood pressure on a daily basis and notify our office if it is >140/90 mmHg.  There are no clinical evidence of heart failure at this time. Follow up in 6 months, sooner  if needed, for follow up of hypertension, hyperlipidemia, LBBB.    Alethia Berthold, PA-C 05/05/2021, 2:37 PM Office: [phone]

## 2021-05-04 NOTE — Progress Notes (Signed)
Oncology Nurse Navigator Documentation  Placed introductory call to new referral patient [name] [name]  Introduced myself as the H&N oncology nurse navigator that works with Dr. Isidore Moos to whom he has been referred by Dr. Constance Holster.  He confirmed understanding of referral.  Briefly explained my role as his navigator, provided my contact information.   Confirmed understanding of upcoming appts and [place] location, explained arrival and registration process.  I explained the purpose of a possible dental evaluation prior to starting RT, indicated he would be contacted by [facility] to arrange an appt.    I encouraged him to call with questions/concerns as he moves forward with appts and procedures.    He verbalized understanding of information provided, expressed appreciation for my call.   Navigator Initial Assessment . Employment Status: he is retired . Currently on FMLA / STD: no . Living Situation: He lives with his wife . Support System: family, friends . PCP: Marton Redwood MD . PCD:  Cloretta Ned DDS . Financial Concerns: no . Transportation Needs: no . Sensory Deficits: no . Language Barriers/Interpreter Needed:  no . Ambulation Needs: no . DME Used in Home: no . Psychosocial Needs:  no . Concerns/Needs Understanding Cancer:  addressed/answered by navigator to best of ability . Self-Expressed Needs: no   Harlow Asa RN, BSN, OCN Head & Neck Oncology Nurse [place] at [facility] Phone # [phone]  Fax # [phone]

## 2021-05-05 ENCOUNTER — Ambulatory Visit
Admission: RE | Admit: 2021-05-05 | Discharge: 2021-05-05 | Disposition: A | Discharge status: Home / Self Care | Payer: Medicare Other | Source: Ambulatory Visit | Attending: Radiation Oncology | Admitting: Radiation Oncology

## 2021-05-05 VITALS — BP 135/86 | HR 77 | Temp 97.6°F | Resp 20 | Ht 72.0 in | Wt 283.2 lb

## 2021-05-05 DIAGNOSIS — Z7982 Long term (current) use of aspirin: Secondary | ICD-10-CM | POA: Insufficient documentation

## 2021-05-05 DIAGNOSIS — N529 Male erectile dysfunction, unspecified: Secondary | ICD-10-CM | POA: Insufficient documentation

## 2021-05-05 DIAGNOSIS — G4730 Sleep apnea, unspecified: Secondary | ICD-10-CM | POA: Insufficient documentation

## 2021-05-05 DIAGNOSIS — E119 Type 2 diabetes mellitus without complications: Secondary | ICD-10-CM | POA: Diagnosis not present

## 2021-05-05 DIAGNOSIS — C021 Malignant neoplasm of border of tongue: Secondary | ICD-10-CM | POA: Insufficient documentation

## 2021-05-05 DIAGNOSIS — R000 Tachycardia, unspecified: Secondary | ICD-10-CM | POA: Diagnosis not present

## 2021-05-05 DIAGNOSIS — C029 Malignant neoplasm of tongue, unspecified: Secondary | ICD-10-CM

## 2021-05-05 NOTE — Patient Instructions (Signed)
Follow-up in 6 months, sooner if needed, for hypertension, hyperlipidemia, frontal branch block,

## 2021-05-05 NOTE — Progress Notes (Signed)
Oncology Nurse Navigator Documentation  Met with patient during initial consult with Dr. Isidore Moos. He was accompanied by his wife Pamala Hurry.  . Further introduced myself as his/their Navigator, explained my role as a member of the Care Team. . Mr. Malizia will not receive radiation at this time. He reports that he has an appointment scheduled with Dr. Constance Holster this month and will discuss surgical options vs. Close observation in the future.   . They verbalized understanding of information provided. . I encouraged them to call with questions/concerns moving forward.  Harlow Asa, RN, BSN, OCN Head & Neck Oncology Nurse [place] at [place] [phone]

## 2021-05-05 NOTE — Progress Notes (Signed)
Radiation Oncology         ([phone]) [phone] ________________________________  Initial Outpatient Consultation  Name: [name] [name] MRN: [id number]  Date: 05/05/2021  DOB: [birth year]  TI:WPYK, Emily Filbert., MD  [name], MD   REFERRING PHYSICIAN: [name], MD  DIAGNOSIS:    ICD-10-CM   1. Squamous cell carcinoma of lateral tongue (HCC)  C02.1    Cancer Staging Squamous cell carcinoma of lateral tongue (HCC) Staging form: Oral Cavity, AJCC 8th Edition - Pathologic stage from 03/18/2021: Stage Unknown (pT2, pNX, cM0) - Signed by Eppie Gibson, MD on 05/05/2021 Stage prefix: Initial diagnosis Histologic grade (G): G2 Histologic grading system: 3 grade system  CHIEF COMPLAINT: Here to discuss management of tongue cancer  HISTORY OF PRESENT ILLNESS::[name] [name] is a 65 y.o. male who has a history of lichen planus on his mouth for about the past 6 or 7 years. He additionally had a benign lesion removed from his tongue about 6 or 7 years ago. He was doing relatively well until earlier this year when he developed a sore on the right side of his tongue that started to gradually enlarge. Biopsy taken of his tongue on 01/20/21 was sent to [facility] and was consistent with squamous cell carcinoma (neg for HPV).   Subsequently, the patient saw Dr. Dyanne Carrel on 02/24/21 who recommended CT of the neck to rule out adenopathy and to give more information regarding the parotid mass. He also recommended a PET scan to evaluate for any adenopathy or distant disease. Partial glossectomy was noted to be recommended as well.    Biopsy of right tongue taken on 01/20/21 revealed: a malignant neoplasm arising from the covering tissue which invades the connective tissue to all margins. It comprised nests of anaplastic epidermoid cells with intracellular keratinization, and small areas of keratin pearl formation. The depth of invasion could not be assessed due to lack of surface epithelium. The surface focally  displays ulceration with abscess formation.   Pertinent imaging thus far includes CT of neck  performed on 03/02/2021 revealing findings to be normal of the pharynx and larynx, with no mass or swelling seen. No mass lesion involving the tongue  identified as well.    PET of skull base to thigh taken on 03/23/21 showed focal hypermetabolic activity within the mid right tongue; consistent with known tongue cancer. No cervical nodal metastases or other mucosal lesions were identified either.  The patient underwent partial glossectomy on 03/11/21. Surgical pathology showed :invasive squamous cell carcinoma,  grade 2, 2.2 x 1.2 cm, with a tumor depth invasion of 5 mm. Margins of resection are not involved - clear by at least 46mm. No LVSI, no PNI. No nodes removed.  The patient followed up with Dr. Constance Holster on 04/03/21 in which it was noted that the patient had recovered well from partial glossectomy, the tongue appeared to be very close to being completley healed with no signs of infection. .   Swallowing issues, if any: None, although has a referral for swallowing therapy  Weight Changes: Yes, he reports that he lost 20 lbs unintentionally after surgery (do to decreased diet) and recovering from c.diff infection.  Pain status: Presumably none   Other symptoms: Patient has a history of sinus tachycardia, and is a type 2 diabetic.  Tobacco history, if any: Never smoked and never vaped.  ETOH abuse, if any: Denies any consumption of alcohol  Prior cancers, if any: None  PREVIOUS RADIATION THERAPY: No  PAST MEDICAL HISTORY:  has  a past medical history of Arthritis, Chest pain, atypical, Diabetes mellitus, ED (erectile dysfunction), GERD (gastroesophageal reflux disease), H/O bee sting allergy, History of kidney stones, Hyperlipidemia, Hypertension, LBBB (left bundle branch block), Lichen planus, Obesity, OSA (obstructive sleep apnea), and Sleep apnea.    PAST SURGICAL HISTORY: Past Surgical History:   Procedure Laterality Date   ANAL FISSURE REPAIR     CARDIAC CATHETERIZATION N/A 04/25/2015   Procedure: Left Heart Cath and Coronary Angiography;  Surgeon: Peter M Martinique, MD;  Location: [facility];  Service: Cardiovascular;  Laterality: N/A;   CARDIOVASCULAR STRESS TEST  01/16/2010   EF 54%   COLONOSCOPY     KNEE ARTHROSCOPY Right 05/10/2014   Procedure: RIGHT KNEE ARTHROSCOPY WITH PARTIAL MEDIAL MENISCECTOMY;  Surgeon: Mcarthur Rossetti, MD;  Location: [facility];  Service: Orthopedics;  Laterality: Right;   SINUS SURGERY WITH INSTATRAK     US ECHOCARDIOGRAPHY  09/19/2007   EF 55-60%   VASECTOMY      FAMILY HISTORY: family history includes Heart disease in his father and mother; Hypertension in his mother; Kidney disease in his father; Kidney failure in his father; Rheum arthritis in his mother.  SOCIAL HISTORY:  reports that he has never smoked. He has never used smokeless tobacco. He reports that he does not drink alcohol and does not use drugs.  ALLERGIES: Bee venom, Ezetimibe, Insulin degludec-liraglutide, Other, Bydureon [exenatide], and Penicillins  MEDICATIONS:  Current Outpatient Medications  Medication Sig Dispense Refill   aspirin EC 81 MG tablet Take 81 mg by mouth daily. Swallow whole.     Cholecalciferol (DIALYVITE VITAMIN D 5000) 125 MCG (5000 UT) capsule Take 5,000 Units by mouth daily.     diphenhydramine-acetaminophen (TYLENOL PM EXTRA STRENGTH) 25-500 MG TABS tablet 1 tablet at bedtime as needed     Dulaglutide 1.5 MG/0.5ML SOPN Inject 1.5 mg into the skin once a week.     ELDERBERRY PO Take 1 capsule by mouth daily.     EPINEPHrine 0.3 mg/0.3 mL IJ SOAJ injection Inject 0.3 mg into the muscle as needed for anaphylaxis.     metoprolol succinate (TOPROL-XL) 25 MG 24 hr tablet Take 1 tablet (25 mg total) by mouth daily. (Patient taking differently: Take 50 mg by mouth daily.) 30 tablet 3   metroNIDAZOLE (FLAGYL) 500 MG tablet Take 500 mg by mouth 3 (three)  times daily.     Misc Natural Products (TURMERIC CURCUMIN) CAPS Take 1 capsule by mouth daily. (Patient not taking: Reported on 05/04/2021)     Multiple Vitamin (MULTIVITAMIN) tablet Take 1 tablet by mouth daily.     omeprazole (PRILOSEC) 20 MG capsule Take 20 mg by mouth every other day.     rosuvastatin (CRESTOR) 10 MG tablet Take 10 mg by mouth every other day.     sertraline (ZOLOFT) 50 MG tablet Take 25 mg by mouth daily.     tadalafil (CIALIS) 20 MG tablet Take 20 mg by mouth daily as needed for erectile dysfunction.     tamsulosin (FLOMAX) 0.4 MG CAPS capsule Take 0.4 mg by mouth daily.     telmisartan-hydrochlorothiazide (MICARDIS HCT) 80-25 MG tablet Take 1 tablet by mouth daily.     traMADol (ULTRAM) 50 MG tablet Take 50 mg by mouth every 6 (six) hours as needed for moderate pain.     zinc gluconate 50 MG tablet Take 50 mg by mouth daily.     No current facility-administered medications for this encounter.    REVIEW OF SYSTEMS:  Notable for that above.   PHYSICAL EXAM:  height is 6' (1.829 m) and weight is 283 lb 3.2 oz (128.5 kg). His temperature is 97.6 F (36.4 C). His blood pressure is 135/86 and his pulse is 77. His respiration is 20 and oxygen saturation is 100%.   General: Alert and oriented, in no acute distress HEENT: Head is normocephalic. Extraocular movements are intact. Oropharynx is notable for no upper throat lesions; tongue healing well, no sign of recurrence. Neck: Neck is notable for muscular large neck, no obvious palpable masses Heart: Regular in rate and rhythm with no murmurs, rubs, or gallops. Chest: Clear to auscultation bilaterally, with no rhonchi, wheezes, or rales. Abdomen: normoactive bowel sounds Lymphatics: see Neck Exam Skin: No concerning lesions. Musculoskeletal: symmetric strength and muscle tone throughout. Neurologic: Cranial nerves II through XII are grossly intact. No obvious focalities. Speech is fluent. Coordination is intact. Psychiatric:  Judgment and insight are intact. Affect is appropriate.   ECOG = 0  0 - Asymptomatic (Fully active, able to carry on all predisease activities without restriction)  1 - Symptomatic but completely ambulatory (Restricted in physically strenuous activity but ambulatory and able to carry out work of a light or sedentary nature. For example, light housework, office work)  2 - Symptomatic, <50% in bed during the day (Ambulatory and capable of all self care but unable to carry out any work activities. Up and about more than 50% of waking hours)  3 - Symptomatic, >50% in bed, but not bedbound (Capable of only limited self-care, confined to bed or chair 50% or more of waking hours)  4 - Bedbound (Completely disabled. Cannot carry on any self-care. Totally confined to bed or chair)  5 - Death   Eustace Pen MM, Creech RH, Tormey DC, et al. [phone]). "Toxicity and response criteria of the [facility]". [place] Oncol. 5 (6): 649-55   LABORATORY DATA:  Lab Results  Component Value Date   WBC 5.6 03/11/2021   HGB 14.6 03/11/2021   HCT 42.8 03/11/2021   MCV 88.6 03/11/2021   PLT 227 03/11/2021   CMP     Component Value Date/Time   NA 137 03/11/2021 1029   K 3.8 03/11/2021 1029   CL 102 03/11/2021 1029   CO2 27 03/11/2021 1029   GLUCOSE 138 (H) 03/11/2021 1029   BUN 13 03/11/2021 1029   CREATININE 0.88 03/11/2021 1029   CREATININE 0.75 04/21/2015 0856   CALCIUM 9.5 03/11/2021 1029   GFRNONAA >60 03/11/2021 1029   GFRAA >90 05/07/2014 1500      No results found for: TSH   RADIOGRAPHY: as above    IMPRESSION/PLAN:  This is a delightful patient with head and neck cancer - tongue squamous cell carcinoma.  Neck has not been surgically staged but clinically neck is negative on imaging. I have personally reviewed his path and imaging.    I recommend he undergo neck dissection with Dr. Constance Holster of the high / moderate risk nodes to complete his oncologic treatment.  If  multiple nodes, ECE, or a high burden of disease is yielded in the surgical path, I would recommend adjuvant radiotherapy.   We discussed the potential risks, benefits, and side effects of radiotherapy. We talked in detail about acute and late effects. We discussed that some of the most bothersome acute effects may be mucositis, dysgeusia, salivary changes, skin irritation, hair loss, dehydration, weight loss and fatigue. We talked about late effects which include but are not necessarily  limited to dysphagia, hypothyroidism, nerve injury, vascular injury, spinal cord injury, xerostomia, trismus, dental issues, neck edema, and potential injury to any of the tissues in the head and neck region. Again, I would only recommend RT if his staging increased adequately after neck dissection.  He will discuss this w/ Dr Constance Holster soon. It is possible he will opt out of surgery and choose close surveillance. If this is the case I recommend CT scans to monitor the neck as his exam is limited in sensitivity given his anatomy.  I have personally reached out to Dr. Constance Holster re: my recommendations.  On date of service, in total, I spent 60 minutes on this encounter. Patient was seen in person.  __________________________________________   Eppie Gibson, MD  This document serves as a record of services personally performed by Eppie Gibson, MD. It was created on his behalf by Roney Mans, a trained medical scribe. The creation of this record is based on the scribe's personal observations and the provider's statements to them. This document has been checked and approved by the attending provider.

## 2021-05-09 ENCOUNTER — Encounter: Payer: Self-pay | Admitting: Radiation Oncology

## 2021-06-25 ENCOUNTER — Telehealth: Payer: Self-pay | Admitting: Neurology

## 2021-06-25 ENCOUNTER — Ambulatory Visit: Payer: Self-pay | Admitting: Neurology

## 2021-06-25 NOTE — Telephone Encounter (Signed)
Pt cancelled appt due to schedule conflict. Rescheduled appt 07/27/21 at 7:30 am.

## 2021-06-25 NOTE — Telephone Encounter (Signed)
Noted, looks like this is his first same day cancellation.

## 2021-07-27 ENCOUNTER — Encounter: Payer: Self-pay | Admitting: Neurology

## 2021-07-27 ENCOUNTER — Other Ambulatory Visit: Payer: Self-pay

## 2021-07-27 ENCOUNTER — Ambulatory Visit: Payer: Medicare Other | Admitting: Neurology

## 2021-07-27 VITALS — BP 123/73 | HR 71 | Ht 73.0 in | Wt 285.8 lb

## 2021-07-27 DIAGNOSIS — Z9989 Dependence on other enabling machines and devices: Secondary | ICD-10-CM

## 2021-07-27 DIAGNOSIS — G4733 Obstructive sleep apnea (adult) (pediatric): Secondary | ICD-10-CM | POA: Diagnosis not present

## 2021-07-27 NOTE — Patient Instructions (Signed)
It was nice to see you again today.  You are compliant with your AutoPap machine and your apnea scores look good.  Your leak tends to be high from the mask, this is probably because you are using a fullface mask and facial hair can dislodge the mask a little bit.  Please see if you can comfortably tighten and a little bit more.  Please continue using your autoPAP regularly. While your insurance requires that you use PAP at least 4 hours each night on 70% of the nights, I recommend, that you not skip any nights and use it throughout the night if you can. Getting used to PAP and staying with the treatment long term does take time and patience and discipline. Untreated obstructive sleep apnea when it is moderate to severe can have an adverse impact on cardiovascular health and raise her risk for heart disease, arrhythmias, hypertension, congestive heart failure, stroke and diabetes. Untreated obstructive sleep apnea causes sleep disruption, nonrestorative sleep, and sleep deprivation. This can have an impact on your day to day functioning and cause daytime sleepiness and impairment of cognitive function, memory loss, mood disturbance, and problems focussing. Using PAP regularly can improve these symptoms.  Please follow-up routinely to see one of our nurse practitioners in 1 year.

## 2021-07-27 NOTE — Progress Notes (Signed)
Subjective:    Patient ID: [name] [name] is a 65 y.o. male.  HPI    Interim history:   [name] [name] is a 66 year old left-handed gentleman with an underlying medical history of diabetes, hypertension, hyperlipidemia, ED, lichen planus, SCC of the tongue with s/p partial glossectomy in 02/2021, and obesity, who presents for follow-up consultation of his obstructive sleep apnea after interim home sleep testing and starting AutoPap therapy.  The patient is unaccompanied today. He missed an appointment on 06/25/21. I last saw him on 12/30/2020 for re-evaluation of his sleep apnea at the request of his cardiologist.  He was advised to proceed with a sleep study.  He had a home sleep test on 02/02/2021 which indicated overall mild sleep apnea with an AHI of 9/h, O2 nadir 87%.  We did not have records from his old CPAP machine.  He was advised to start AutoPap therapy.  His set up date was 03/19/2021.  Today, 07/27/2021: I reviewed his AutoPap compliance data from 06/23/2021 through 07/22/2021 which is a total of 30 days, during which time he used his machine 21 days with percent used days greater than 4 hours at 70%, indicating adequate compliance with an average usage of 9 hours and 24 minutes, residual AHI at goal at 1.1/h, average pressure for the 95th percentile at 9.2 cm with a range of 6 to 12 cm with EPR, 95th percentile of pressure elevated at 37.9 L/min.    I reviewed his AutoPap compliance data from 05/25/2021 through 06/23/2021, which is a total of 30 days, during which time he used his machine 29 days with percent use days greater than 4 hours at 97%, indicating excellent compliance with an average usage of 9 hours and 27 minutes, residual AHI at goal at 1.3/h, average pressure for the 95th percentile at 9 cm with a range of 6 to 12 cm with EPR, leak on the higher side with a 95th percentile at 38.2 L/min.  He reports that he has adjusted well to his AutoPap.  He had a gap in his compliance in late July and  first week of August.  He reports that he used his old machine on vacation and kept his new machine at home.  He has generally been fully compliant and this is visible on compliance report as well.  I reviewed his 90-day compliance as well and he has been excellent with his compliance.  He is adjusting to his current surgery.  He has no significant pain but has some numbness in that tongue area and the right posterior area of his mouth and sometimes food is lodged in there and he does not notice it until it moves.  Of note, he was diagnosed with squamous cell carcinoma of the tongue and is now status post partial glossectomy since April 2022.  He uses a full facemask.  He reports that given his lichen planus he was not able to use a nasal mask as the area inside his mouth where the nasal mask hit the upper lip became irritated in the past.  He has noticed a leak from the mass.  He reports that he may not be tightening it well enough.   The patient's allergies, current medications, family history, past medical history, past social history, past surgical history and problem list were reviewed and updated as appropriate.    Previously:  12/30/2020: He reports that he uses his old machine religiously.  He did not bring his machine today.  He has a [employer]	[place]  escape to auto machine.  He uses a full facemask.  He has not been able to get supplies from his DME company and has bought supplies online.  His previous DME company was [facility].  Bedtime is around 930 and rise time between 7 and 8.  He retired in September 2021.  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  He is a non-smoker and does not drink any alcohol but does like to drink caffeine in the form of coffee, about 3 cups in the morning.  His weight has been fairly stable.  He lives with his wife and they have 1 dog in the household.  He has an Epworth sleepiness score 6 out of 24, fatigue severity score is 34 out of 63.  He believes that his fatigue/energy  level has been affected by the recent addition of a beta-blocker.  It has made him tired but not frankly sleepy.  He has an appointment with his cardiologist coming up soon, Dr. Einar Gip.  01/25/19: (He) was previously diagnosed with obstructive sleep apnea and placed on CPAP therapy. I reviewed your office note from 12/08/2018, which you kindly included. He had a sleep study in 2000 for which I was able to review. This was a split-night sleep study. He was noted to have loud snoring. Total AHI was not reported. He was titrated on CPAP up to a pressure of 14 cm. A CPAP compliance download was not available for my review today. The patient has also seen Dr. Gwenette Greet in the past. He reports compliance with his CPAP but he is unable to get new supplies. His current DME company is [facility]. His machine is older, about 67 years old. His Epworth sleepiness score is 5 out of 24 today, fatigue score is 18 out of 63. He is married and lives with his wife. They have 2 children. He is a nonsmoker and does not utilize alcohol and drinks caffeine in the form of coffee, 3-4 cups per day in the mornings typically. 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 He plans to retire in a year or year and a half. He lives with his wife, they have 2 daughters, one dog in the household concurrently 1 cat. He does not watch TV in the bedroom. He has nocturia about twice per average night and denies recurrent morning headaches, has had the occasional morning headache in the context of sinus congestion. He believes that his father had sleep apnea but was not on CPAP therapy. His bedtime is around 9, rise time around 6 or 7 currently. He is trying to lose weight. He has lost about 25 pounds within the last year. He had a tonsillectomy at age 73. He reports numbness in the left more than right hand especially his fingers, especially exacerbated with typing. He has more symptoms at night also, left more than right. He's wondering if he has  carpal tunnel syndrome.    His Past Medical History Is Significant For: Past Medical History:  Diagnosis Date   Arthritis    KNEES AND NECK; TORN MENISCUS RT KNEE   Chest pain, atypical    Diabetes mellitus    ORAL MEDS   ED (erectile dysfunction)    GERD (gastroesophageal reflux disease)    H/O bee sting allergy    History of kidney stones    Hyperlipidemia    Hypertension    LBBB (left bundle branch block)    Lichen planus    OF MOUTH  Obesity    OSA (obstructive sleep apnea)    USES CPAP - DOES NOT KNOW SETTING   Sleep apnea    wears CPAP    His Past Surgical History Is Significant For: Past Surgical History:  Procedure Laterality Date   ANAL FISSURE REPAIR     CARDIAC CATHETERIZATION N/A 04/25/2015   Procedure: Left Heart Cath and Coronary Angiography;  Surgeon: Peter M Martinique, MD;  Location: [facility];  Service: Cardiovascular;  Laterality: N/A;   CARDIOVASCULAR STRESS TEST  01/16/2010   EF 54%   COLONOSCOPY     KNEE ARTHROSCOPY Right 05/10/2014   Procedure: RIGHT KNEE ARTHROSCOPY WITH PARTIAL MEDIAL MENISCECTOMY;  Surgeon: Mcarthur Rossetti, MD;  Location: [facility];  Service: Orthopedics;  Laterality: Right;   oral cancer  02/2021   partial tongue   SINUS SURGERY WITH INSTATRAK     US ECHOCARDIOGRAPHY  09/19/2007   EF 55-60%   VASECTOMY      His Family History Is Significant For: Family History  Problem Relation Age of Onset   Hypertension Mother    Heart disease Mother    Rheum arthritis Mother    Kidney failure Father    Kidney disease Father    Heart disease Father    Colon cancer Neg Hx    Esophageal cancer Neg Hx    Stomach cancer Neg Hx    Rectal cancer Neg Hx     His Social History Is Significant For: Social History   Socioeconomic History   Marital status: Married    Spouse name: Pamala Hurry   Number of children: 2   Years of education: Not on file   Highest education level: Not on file  Occupational History   Occupation:  Press photographer Rep   Tobacco Use   Smoking status: Never   Smokeless tobacco: Never  Vaping Use   Vaping Use: Never used  Substance and Sexual Activity   Alcohol use: No    Alcohol/week: 0.0 standard drinks   Drug use: No   Sexual activity: Yes  Other Topics Concern   Not on file  Social History Narrative   Not on file   Social Determinants of Health   Financial Resource Strain: Not on file  Food Insecurity: Not on file  Transportation Needs: Not on file  Physical Activity: Not on file  Stress: Not on file  Social Connections: Not on file    His Allergies Are:  Allergies  Allergen Reactions   Bee Venom Anaphylaxis   Ezetimibe     Other reaction(s): Unknown   Insulin Degludec-Liraglutide     Other reaction(s): local skin reaction   Other     Other reaction(s): myalgia   Bydureon [Exenatide] Rash   Penicillins Hives and Rash  :   His Current Medications Are:  Outpatient Encounter Medications as of 07/27/2021  Medication Sig   aspirin EC 81 MG tablet Take 81 mg by mouth daily. Swallow whole.   Cholecalciferol (DIALYVITE VITAMIN D 5000) 125 MCG (5000 UT) capsule Take 5,000 Units by mouth daily.   diphenhydramine-acetaminophen (TYLENOL PM EXTRA STRENGTH) 25-500 MG TABS tablet 1 tablet at bedtime as needed   Dulaglutide 1.5 MG/0.5ML SOPN Inject 1.5 mg into the skin once a week.   ELDERBERRY PO Take 1 capsule by mouth daily.   EPINEPHrine 0.3 mg/0.3 mL IJ SOAJ injection Inject 0.3 mg into the muscle as needed for anaphylaxis.   metoprolol succinate (TOPROL-XL) 25 MG 24 hr tablet Take 1 tablet (25 mg  total) by mouth daily. (Patient taking differently: Take 50 mg by mouth daily.)   Multiple Vitamin (MULTIVITAMIN) tablet Take 1 tablet by mouth daily.   omeprazole (PRILOSEC) 20 MG capsule Take 20 mg by mouth every other day.   rosuvastatin (CRESTOR) 10 MG tablet Take 10 mg by mouth every other day.   sertraline (ZOLOFT) 50 MG tablet Take 25 mg by mouth daily.   tadalafil (CIALIS) 20  MG tablet Take 20 mg by mouth daily as needed for erectile dysfunction.   tamsulosin (FLOMAX) 0.4 MG CAPS capsule Take 0.4 mg by mouth daily.   telmisartan-hydrochlorothiazide (MICARDIS HCT) 80-25 MG tablet Take 1 tablet by mouth daily.   traMADol (ULTRAM) 50 MG tablet Take 50 mg by mouth every 6 (six) hours as needed for moderate pain.   zinc gluconate 50 MG tablet Take 50 mg by mouth daily.   Misc Natural Products (TURMERIC CURCUMIN) CAPS Take 1 capsule by mouth daily. (Patient not taking: Reported on 07/27/2021)   [DISCONTINUED] metroNIDAZOLE (FLAGYL) 500 MG tablet Take 500 mg by mouth 3 (three) times daily. (Patient not taking: Reported on 07/27/2021)   No facility-administered encounter medications on file as of 07/27/2021.  :  Review of Systems:  Out of a complete 14 point review of systems, all are reviewed and negative with the exception of these symptoms as listed below:  Review of Systems  Neurological:        New cpap f/u, doing well. ESS: 4.    Objective:  Neurological Exam  Physical Exam Physical Examination:   Vitals:   07/27/21 0717  BP: 123/73  Pulse: 71    General Examination: The patient is a very pleasant 65 y.o. male in no acute distress. He appears well-developed and well-nourished and well groomed.   HEENT: Normocephalic, atraumatic, pupils are equal, round and reactive to light, tracking is well-preserved, hearing is mildly impaired.  Face is symmetric, speech without dysarthria, neck is supple, no carotid bruits.  Airway examination reveals status post partial right glossectomy, no obvious irritation.  No significant mouth dryness noted.  Chest: Clear to auscultation without wheezing, rhonchi or crackles noted.   Heart: S1+S2+0, regular and normal without murmurs, rubs or gallops noted.    Abdomen: Soft, non-tender and non-distended.   Extremities: There is no pitting edema in the distal lower extremities bilaterally.    Skin: Warm and dry without  trophic changes noted.   Musculoskeletal: exam reveals no obvious joint deformities.    Neurologically:  Mental status: The patient is awake, alert and oriented in all 4 spheres. His immediate and remote memory, attention, language skills and fund of knowledge are appropriate. There is no evidence of aphasia, agnosia, apraxia or anomia. Speech is clear with normal prosody and enunciation. Thought process is linear. Mood is normal and affect is normal.  Cranial nerves II - XII are as described above under HEENT exam.  Motor exam: Normal bulk, strength and tone is noted. No tremor. Fine motor skills and coordination: grossly intact.  Cerebellar testing: No dysmetria or intention tremor.  Sensory exam: intact to light touch.  Gait, station, balance: He stands without difficulty, he has no difficulty walking.   Assessment and Plan:  In summary, [name] [name] is a very pleasant 66 year old male with an underlying medical history of diabetes, hypertension, hyperlipidemia, ED, and obesity, who presents for follow-up consultation of his obstructive sleep apnea, after home sleep testing in March 2022 and starting treatment with a new machine.  He was originally  diagnosed over 15 years ago and had an older CPAP machine.  He did have some weight fluctuation and has lost approximately 45 pounds.  He has established AutoPap treatment since April 2022.  He continues to benefit from treatment and is compliant.  He is commended for his treatment adherence.  He is motivated to continue with treatment and recently received supplies.  He is encouraged to try to see if he can comfortably tighten the mask a little more.  Given his beard, he will probably have some leak from the full facemask.  He is also adjusting to his partial right glossectomy.  He has an appointment pending with his ENT.  He is advised to follow-up for sleep apnea management routinely in 1 year.  He can see one of our nurse practitioners.  I answered  all his questions today and he was in agreement. I spent 30 minutes in total face-to-face time and in reviewing records during pre-charting, more than 50% of which was spent in counseling and coordination of care, reviewing test results, reviewing medications and treatment regimen and/or in discussing or reviewing the diagnosis of OSA, the prognosis and treatment options. Pertinent laboratory and imaging test results that were available during this visit with the patient were reviewed by me and considered in my medical decision making (see chart for details).

## 2021-08-13 ENCOUNTER — Other Ambulatory Visit: Payer: Self-pay | Admitting: Otolaryngology

## 2021-08-13 DIAGNOSIS — C029 Malignant neoplasm of tongue, unspecified: Secondary | ICD-10-CM

## 2021-08-31 ENCOUNTER — Ambulatory Visit
Admission: RE | Admit: 2021-08-31 | Discharge: 2021-08-31 | Disposition: A | Discharge status: Home / Self Care | Payer: Medicare Other | Source: Ambulatory Visit | Attending: Otolaryngology | Admitting: Otolaryngology

## 2021-08-31 DIAGNOSIS — C029 Malignant neoplasm of tongue, unspecified: Secondary | ICD-10-CM

## 2021-08-31 MED ORDER — IOPAMIDOL (ISOVUE-300) INJECTION 61%
75.0000 mL | Freq: Once | INTRAVENOUS | Status: AC | PRN
  Administered 2021-08-31: 75 mL via INTRAVENOUS

## 2021-11-03 ENCOUNTER — Ambulatory Visit: Payer: Medicare Other | Admitting: Student

## 2021-12-07 ENCOUNTER — Other Ambulatory Visit: Payer: Self-pay

## 2021-12-07 ENCOUNTER — Encounter: Payer: Self-pay | Admitting: Orthopaedic Surgery

## 2021-12-07 ENCOUNTER — Ambulatory Visit (INDEPENDENT_AMBULATORY_CARE_PROVIDER_SITE_OTHER): Payer: Medicare Other | Admitting: Orthopaedic Surgery

## 2021-12-07 ENCOUNTER — Encounter: Payer: Self-pay | Admitting: Student

## 2021-12-07 ENCOUNTER — Ambulatory Visit: Payer: Self-pay

## 2021-12-07 ENCOUNTER — Ambulatory Visit: Payer: Medicare Other | Admitting: Student

## 2021-12-07 VITALS — BP 130/75 | HR 84 | Temp 98.6°F | Ht 73.0 in | Wt 289.0 lb

## 2021-12-07 DIAGNOSIS — M654 Radial styloid tenosynovitis [de Quervain]: Secondary | ICD-10-CM

## 2021-12-07 DIAGNOSIS — I447 Left bundle-branch block, unspecified: Secondary | ICD-10-CM

## 2021-12-07 DIAGNOSIS — I10 Essential (primary) hypertension: Secondary | ICD-10-CM

## 2021-12-07 DIAGNOSIS — E7800 Pure hypercholesterolemia, unspecified: Secondary | ICD-10-CM

## 2021-12-07 DIAGNOSIS — M25532 Pain in left wrist: Secondary | ICD-10-CM | POA: Diagnosis not present

## 2021-12-07 MED ORDER — LIDOCAINE HCL 1 % IJ SOLN
1.0000 mL | INTRAMUSCULAR | Status: AC | PRN
  Administered 2021-12-07: 1 mL

## 2021-12-07 MED ORDER — METHYLPREDNISOLONE ACETATE 40 MG/ML IJ SUSP
40.0000 mg | INTRAMUSCULAR | Status: AC | PRN
  Administered 2021-12-07: 40 mg

## 2021-12-07 NOTE — Progress Notes (Signed)
+  Office Visit Note   Patient: [name] [name]           Date of Birth: 1956/11/12           MRN: [id number] Visit Date: 12/07/2021              Requested by: Ginger Organ., MD [address] [place],  [place] [address] PCP: Ginger Organ., MD   Assessment & Plan: Visit Diagnoses:  1. Pain in left wrist   2. Tenosynovitis, de Quervain     Plan: His signs and symptoms of the left wrist are consistent with de Quervain's tenosynovitis.  I did talk to him about trying a steroid injection of this area and he agreed to this and tolerated well.  I described the risk and benefits of steroid injections.  This did cause him to have pain relief fortunately right after the injection so there is a good chance of this working.  Also want him to try Voltaren gel 2-3 times a day with a small amount over this area.  I would always repeat an injection in 6 to 8 weeks if this is not getting better.  All questions and concerns were answered addressed.  Follow-up is as needed.  Follow-Up Instructions: Return if symptoms worsen or fail to improve.   Orders:  Orders Placed This Encounter  Procedures   Hand/UE Inj   XR Wrist Complete Left   No orders of the defined types were placed in this encounter.     Procedures: Hand/UE Inj: L extensor compartment 1 for de Quervain's tenosynovitis on 12/07/2021 10:08 AM Medications: 1 mL lidocaine 1 %; 40 mg methylPREDNISolone acetate 40 MG/ML     Clinical Data: No additional findings.   Subjective: Chief Complaint  Patient presents with   Left Wrist - Pain  The patient is a 66 year old gentleman who comes in with left wrist pain he points to the first dorsal compartment of his left wrist as a source of his pain.  There is been no known injury.  He denies any swelling.  He denies any numbness or tingling.  He has worn a wrist brace.  This is been going on for about a month.  He is not a diabetic.  He has been dealing with tongue cancer over the  last year and had surgery as it relates to that.  He is not a smoker.  This was due to an autoimmune disease.  HPI  Review of Systems There is currently listed no headache, chest pain, shortness of breath, fever, chills, nausea, vomiting  Objective: Vital Signs: There were no vitals taken for this visit.  Physical Exam He is alert and orient x3 and in no acute distress Ortho Exam Examination of his left hand and wrist shows a well-perfused hand and fingers.  There is palpable pulses in his wrist.  There is no muscle atrophy no redness.  He does have a positive Finkelstein test with pain over the first dorsal compartment. Specialty Comments:  No specialty comments available.  Imaging: XR Wrist Complete Left  Result Date: 12/07/2021 3 views left wrist show no acute findings.    PMFS History: Patient Active Problem List   Diagnosis Date Noted   Squamous cell carcinoma of lateral tongue ([facility]) 03/11/2021   Abnormal nuclear stress test 04/25/2015   Chest pain 04/04/2015   Right knee meniscal tear 05/10/2014   LBBB (left bundle branch block)    Obesity    DM 10/16/2008  Obstructive sleep apnea 10/16/2008   ALLERGIC RHINITIS 10/16/2008   Hyperlipidemia 10/15/2008   Essential hypertension 10/15/2008   Past Medical History:  Diagnosis Date   Arthritis    KNEES AND NECK; TORN MENISCUS RT KNEE   Chest pain, atypical    Diabetes mellitus    ORAL MEDS   ED (erectile dysfunction)    GERD (gastroesophageal reflux disease)    H/O bee sting allergy    History of kidney stones    Hyperlipidemia    Hypertension    LBBB (left bundle branch block)    Lichen planus    OF MOUTH   Obesity    OSA (obstructive sleep apnea)    USES CPAP - DOES NOT KNOW SETTING   Sleep apnea    wears CPAP    Family History  Problem Relation Age of Onset   Hypertension Mother    Heart disease Mother    Rheum arthritis Mother    Kidney failure Father    Kidney disease Father    Heart disease  Father    Colon cancer Neg Hx    Esophageal cancer Neg Hx    Stomach cancer Neg Hx    Rectal cancer Neg Hx     Past Surgical History:  Procedure Laterality Date   ANAL FISSURE REPAIR     CARDIAC CATHETERIZATION N/A 04/25/2015   Procedure: Left Heart Cath and Coronary Angiography;  Surgeon: Peter M Martinique, MD;  Location: [facility];  Service: Cardiovascular;  Laterality: N/A;   CARDIOVASCULAR STRESS TEST  01/16/2010   EF 54%   COLONOSCOPY     KNEE ARTHROSCOPY Right 05/10/2014   Procedure: RIGHT KNEE ARTHROSCOPY WITH PARTIAL MEDIAL MENISCECTOMY;  Surgeon: Mcarthur Rossetti, MD;  Location: [facility];  Service: Orthopedics;  Laterality: Right;   oral cancer  02/2021   partial tongue   SINUS SURGERY WITH INSTATRAK     US ECHOCARDIOGRAPHY  09/19/2007   EF 55-60%   VASECTOMY     Social History   Occupational History   Occupation: Press photographer Rep   Tobacco Use   Smoking status: Never   Smokeless tobacco: Never  Vaping Use   Vaping Use: Never used  Substance and Sexual Activity   Alcohol use: No    Alcohol/week: 0.0 standard drinks   Drug use: No   Sexual activity: Yes

## 2021-12-07 NOTE — Progress Notes (Signed)
Primary Physician/Referring:  Ginger Organ., MD  Patient ID: [name] [name], male    DOB: [birth year], 66 y.o.   MRN: [id number]  Chief Complaint  Patient presents with   Hypertension   Hyperlipidemia   HPI:    [name] [name]  is a 66 y.o. Caucasian male with hypertension, hyperlipidemia, diabetes mellitus, obstructive sleep apnea on CPAP, left bundle branch block who had seen a month ago when he presented with abnormal EKG, fatigue and left arm tingling and numbness which improved with nitroglycerin.  Patient therefore underwent nuclear stress test 10/2020 which was high risk given perfusion defect suggestive of infarct.  Patient underwent echocardiogram 12/2020 which revealed low normal LVEF at 50-55% and left bundle branch block, otherwise no significant abnormalities.  Patient presents for 68-monthfollow-up of hypertension, hyperlipidemia, left bundle branch block.  Last office visit discussed hypertriglyceridemia, however patient opted to focus on diet and lifestyle changes prior to additional medication.  Also last office visit switch patient from Lopressor to Toprol-XL 25 mg daily for blood pressure control.  Patient is feeling well overall without specific complaints today.  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  Denies chest pain, palpitations, dyspnea, syncope, near syncope.  Patient recently had labs done by PCP, will plan to request lipid profile for further review.  Past Medical History:  Diagnosis Date   Arthritis    KNEES AND NECK; TORN MENISCUS RT KNEE   Chest pain, atypical    Diabetes mellitus    ORAL MEDS   ED (erectile dysfunction)    GERD (gastroesophageal reflux disease)    H/O bee sting allergy    History of kidney stones    Hyperlipidemia    Hypertension    LBBB (left bundle branch block)    Lichen planus    OF MOUTH   Obesity    OSA (obstructive sleep apnea)    USES  CPAP - DOES NOT KNOW SETTING   Sleep apnea    wears CPAP   Past Surgical History:  Procedure Laterality Date   ANAL FISSURE REPAIR     CARDIAC CATHETERIZATION N/A 04/25/2015   Procedure: Left Heart Cath and Coronary Angiography;  Surgeon: Peter M JMartinique MD;  [facility];  Service: Cardiovascular;  Laterality: N/A;   CARDIOVASCULAR STRESS TEST  01/16/2010   EF 54%   COLONOSCOPY     KNEE ARTHROSCOPY Right 05/10/2014   Procedure: RIGHT KNEE ARTHROSCOPY WITH PARTIAL MEDIAL MENISCECTOMY;  Surgeon: CMcarthur Rossetti MD;  Location: [facility];  Service: Orthopedics;  Laterality: Right;   oral cancer  02/2021   partial tongue   SINUS SURGERY WITH INSTATRAK     [place]  09[place]19[place]   EF 55-60%   VASECTOMY     Family History  Problem Relation Age of Onset   Hypertension Mother    Heart disease Mother    Rheum arthritis Mother    Kidney failure Father    Kidney disease Father    Heart disease Father    Colon cancer Neg Hx    Esophageal cancer Neg Hx    Stomach cancer Neg Hx    Rectal cancer Neg Hx     Social History   Tobacco Use   Smoking status: Never   Smokeless tobacco: Never  Substance Use Topics   Alcohol use: No    Alcohol/week: 0.0 standard drinks   Marital Status: Married  ROS  Review of Systems  Constitutional: Negative for weight gain.  Cardiovascular:  Positive for dyspnea on exertion (improved). Negative for chest pain, claudication, leg swelling, near-syncope, orthopnea, palpitations, paroxysmal nocturnal dyspnea and syncope.  Respiratory:  Positive for snoring. Negative for shortness of breath.   Hematologic/Lymphatic: Does not bruise/bleed easily.  Musculoskeletal:  Positive for joint pain and neck pain.  All other systems reviewed and are negative. Objective  Blood pressure 130/75, pulse 84, temperature 98.6 F (37 C), temperature source Temporal, height 6' 1"  (1.854 m), weight 289 lb (131.1 kg), SpO2 96 %.  Vitals with BMI  12/07/2021 07/27/2021 05/05/2021  Height 6' 1"  6' 1"  6' 0"   Weight 289 lbs 285 lbs 13 oz 283 lbs 3 oz  BMI 38.14 88.28 00.3  Systolic 491 791 505  Diastolic 75 73 86  Pulse 84 71 77     Physical Exam Vitals reviewed.  Constitutional:      General: He is not in acute distress.    Appearance: He is obese.     Comments: Morbidly obese in no acute distress.  Cardiovascular:     Rate and Rhythm: Normal rate and regular rhythm.     Pulses:          Carotid pulses are 2+ on the right side and 2+ on the left side.      Popliteal pulses are 2+ on the right side and 2+ on the left side.       Dorsalis pedis pulses are 2+ on the right side and 2+ on the left side.       Posterior tibial pulses are 2+ on the right side and 2+ on the left side.     Heart sounds: Normal heart sounds. No murmur heard.   No gallop.     Comments: Femoral pulse difficult to feel due to patient's body habitus.  JVD difficult to see due to short neck. Pulmonary:     Effort: Pulmonary effort is normal.     Breath sounds: Normal breath sounds.  Abdominal:     Comments: Obese. Pannus present  Musculoskeletal:     Right lower leg: No edema.     Left lower leg: No edema.  Neurological:     Mental Status: He is alert.   Laboratory examination:   CMP Latest Ref Rng & Units 03/11/2021 04/21/2015 05/07/2014  Glucose 70 - 99 mg/dL 138(H) 160(H) 214(H)  BUN 8 - 23 mg/dL 13 18 21   Creatinine 0.61 - 1.24 mg/dL 0.88 0.75 0.73  Sodium 135 - 145 mmol/L 137 138 137  Potassium 3.5 - 5.1 mmol/L 3.8 4.5 4.3  Chloride 98 - 111 mmol/L 102 99 97  CO2 22 - 32 mmol/L 27 25 28   Calcium 8.9 - 10.3 mg/dL 9.5 9.4 9.5   CBC Latest Ref Rng & Units 03/11/2021 04/21/2015 05/07/2014  WBC 4.0 - 10.5 K/uL 5.6 5.8 6.3  Hemoglobin 13.0 - 17.0 g/dL 14.6 15.5 14.3  Hematocrit 39.0 - 52.0 % 42.8 45.7 40.9  Platelets 150 - 400 K/uL 227 252 234   Lipid Panel  No results found for: CHOL, TRIG, HDL, CHOLHDL, VLDL, LDLCALC, LDLDIRECT HEMOGLOBIN A1C No  results found for: HGBA1C, MPG TSH No results for input(s): TSH in the last 8760 hours.  External labs:  04/20/2021:  Glucose 170, BUN 13, creatinine 0.80, GFR 96.4, sodium 131, potassium 3.7, AST 17, ALT 29, alk phos 95 Hemoglobin 14.8, hematocrit 43.8, MCV 89.6, platelet 274  03/19/2021: Total cholesterol 135, triglycerides 196,  HDL 33, LDL 63 TSH 1.61 A1c 6.7%.  09/09/2020: Total cholesterol 180, HDL 42, LDL 116, triglycerides 140 A1c 6.3%  Allergies   Allergies  Allergen Reactions   Bee Venom Anaphylaxis   Ezetimibe     Other reaction(s): Unknown   Insulin Degludec-Liraglutide     Other reaction(s): local skin reaction   Other     Other reaction(s): myalgia   Bydureon [Exenatide] Rash   Penicillins Hives and Rash    Medications Prior to Visit:   Outpatient Medications Prior to Visit  Medication Sig Dispense Refill   Cholecalciferol (DIALYVITE VITAMIN D 5000) 125 MCG (5000 UT) capsule Take 5,000 Units by mouth daily.     diphenhydramine-acetaminophen (TYLENOL PM EXTRA STRENGTH) 25-500 MG TABS tablet 1 tablet at bedtime as needed     Dulaglutide 1.5 MG/0.5ML SOPN Inject 1.5 mg into the skin once a week.     ELDERBERRY PO Take 1 capsule by mouth daily.     EPINEPHrine 0.3 mg/0.3 mL IJ SOAJ injection Inject 0.3 mg into the muscle as needed for anaphylaxis.     metoprolol succinate (TOPROL-XL) 25 MG 24 hr tablet Take 1 tablet (25 mg total) by mouth daily. (Patient taking differently: Take 50 mg by mouth daily.) 30 tablet 3   Misc Natural Products (TURMERIC CURCUMIN) CAPS Take 1 capsule by mouth daily.     Multiple Vitamin (MULTIVITAMIN) tablet Take 1 tablet by mouth daily.     omeprazole (PRILOSEC) 20 MG capsule Take 20 mg by mouth every other day.     rosuvastatin (CRESTOR) 10 MG tablet Take 10 mg by mouth every other day.     sertraline (ZOLOFT) 50 MG tablet Take 25 mg by mouth daily.     tadalafil (CIALIS) 20 MG tablet Take 20 mg by mouth daily as needed for erectile  dysfunction.     tamsulosin (FLOMAX) 0.4 MG CAPS capsule Take 0.4 mg by mouth daily.     telmisartan-hydrochlorothiazide (MICARDIS HCT) 80-25 MG tablet Take 1 tablet by mouth daily.     traMADol (ULTRAM) 50 MG tablet Take 50 mg by mouth every 6 (six) hours as needed for moderate pain.     zinc gluconate 50 MG tablet Take 50 mg by mouth daily.     aspirin EC 81 MG tablet Take 81 mg by mouth daily. Swallow whole. (Patient not taking: Reported on 12/07/2021)     No facility-administered medications prior to visit.   Final Medications at End of Visit    Current Meds  Medication Sig   Cholecalciferol (DIALYVITE VITAMIN D 5000) 125 MCG (5000 UT) capsule Take 5,000 Units by mouth daily.   diphenhydramine-acetaminophen (TYLENOL PM EXTRA STRENGTH) 25-500 MG TABS tablet 1 tablet at bedtime as needed   Dulaglutide 1.5 MG/0.5ML SOPN Inject 1.5 mg into the skin once a week.   ELDERBERRY PO Take 1 capsule by mouth daily.   EPINEPHrine 0.3 mg/0.3 mL IJ SOAJ injection Inject 0.3 mg into the muscle as needed for anaphylaxis.   metoprolol succinate (TOPROL-XL) 25 MG 24 hr tablet Take 1 tablet (25 mg total) by mouth daily. (Patient taking differently: Take 50 mg by mouth daily.)   Misc Natural Products (TURMERIC CURCUMIN) CAPS Take 1 capsule by mouth daily.   Multiple Vitamin (MULTIVITAMIN) tablet Take 1 tablet by mouth daily.   omeprazole (PRILOSEC) 20 MG capsule Take 20 mg by mouth every other day.   rosuvastatin (CRESTOR) 10 MG tablet Take 10 mg by mouth every other day.   sertraline (  ZOLOFT) 50 MG tablet Take 25 mg by mouth daily.   tadalafil (CIALIS) 20 MG tablet Take 20 mg by mouth daily as needed for erectile dysfunction.   tamsulosin (FLOMAX) 0.4 MG CAPS capsule Take 0.4 mg by mouth daily.   telmisartan-hydrochlorothiazide (MICARDIS HCT) 80-25 MG tablet Take 1 tablet by mouth daily.   traMADol (ULTRAM) 50 MG tablet Take 50 mg by mouth every 6 (six) hours as needed for moderate pain.   zinc gluconate  50 MG tablet Take 50 mg by mouth daily.   Radiology:   Chest x-ray 04/22/2015: The lungs are adequately inflated and clear. The heart is top-normal in size but stable. The pulmonary vascularity is within the limits of normal. There is no pleural effusion. The mediastinum is normal in width. The bony thorax exhibits no acute abnormality.   IMPRESSION: There is no active cardiopulmonary disease.  Cardiac Studies:   Myocardial perfusion scan 04/17/2015: Myocardial perfusion is abnormal. Moderate sized and intensity, reversible anterior, anteroseptal, septal and apical defect. Findings consistent with ischemia. LBBB is present and can also lead to septal defect, but this is usually a fixed defect. Ischemia is favored. This is an intermediate risk study. Overall left ventricular systolic function was abnormal. LV cavity size is normal. The left ventricular ejection fraction is mildly decreased (50%). There is no prior study for comparison.  Cardiac catheterization 04/25/2015: Right dominant circulation, normal coronary arteries.  Normal LV systolic function, EF 55 to 65%.    Lexiscan Tetrofosmin stress test 11/24/2020: Lexiscan nuclear stress test performed using 1-day protocol. SPECT images show large sized severe intensity, predominantly fixed perfusion defect in apical to basal inferior/inferolateral, and basal inferoseptal myocardium, with corresponding akinesis suggestive of infarct. Stress LVEF 34%. High risk study.   Compared to 04/17/2015, anterior ischemia was noted previously, patient does have a history of normal coronary angiography in 2016.  Echocardiogram 01/03/2021: Study Quality: Technically difficult study. Normal LV systolic function with visual EF 50-55%. Abnormal septal wall motion due to left bundle branch block. Left ventricle cavity is normal in size. Normal global wall motion. Normal diastolic filling pattern, normal LAP.  No significant valvular heart disease. No prior  study for comparison.   EKG:  12/07/2021: Sinus rhythm at a rate of 80 bpm.  Left bundle branch block, no further analysis.  Compared EKG 05/04/2021, no significant change.  EKG 11/18/2020: Sinus tachycardia at rate of 100 bpm, left atrial enlargement, left bundle branch block.  No further analysis.  No change compared to 11/14/2020.  No change in left bundle branch block, compared to 03/27/2014.  Heart rate was 90 bpm.  Assessment     ICD-10-CM   1. Essential hypertension  I10 EKG 12-Lead    2. Hypercholesteremia  E78.00     3. LBBB (left bundle branch block)  I44.7 EKG 12-Lead      There are no discontinued medications.   No orders of the defined types were placed in this encounter.  Orders Placed This Encounter  Procedures   EKG 12-Lead    Recommendations:   [name] [name] is a 66 y.o. Caucasian male with hypertension, hyperlipidemia, diabetes mellitus, obstructive sleep apnea on CPAP, left bundle branch block who had seen a month ago when he presented with abnormal EKG, fatigue and left arm tingling and numbness which improved with nitroglycerin. Patient therefore underwent nuclear stress test 10/2020 which was high risk given perfusion defect suggestive of infarct.  Patient underwent echocardiogram 12/2020 which revealed low normal LVEF at 50-55% and left  bundle branch block, otherwise no significant abnormalities.  Patient presents for 53-monthfollow-up of hypertension, hyperlipidemia, left bundle branch block.  Last office visit discussed hypertriglyceridemia, however patient opted to focus on diet and lifestyle changes prior to additional medication.  Also last office visit switch patient from Lopressor to Toprol-XL 25 mg daily for blood pressure control.  Patient does have history of high risk stress test, however he remains asymptomatic and is on good medical therapy, continue this at this time.  However have requested that recent lipid profile testing by PCP be sent to our office  for review, particularly given history of high triglycerides.  Patient's blood pressure is well controlled, will continue Toprol-XL.  EKG and physical exam remained stable.  Follow-up in 6 months, sooner if needed, for hypertension, hyperlipidemia, left bundle branch block.   CAlethia Berthold PA-C 12/07/2021, 4:14 PM Office: [phone]

## 2021-12-15 NOTE — Progress Notes (Signed)
External labs 09/15/2021: A1c 6.6% Glucose 170, creatinine 0.8, GFR >60, sodium 131, potassium 3.7, AST 17, ALT 21 BUN 13, Hgb 14.8, HCT 43.8, MCV 89.6, platelet 274 Total cholesterol 135, triglycerides 196, HDL 33, LDL 63 TSH 1.16

## 2021-12-16 NOTE — Progress Notes (Signed)
Attempted to call pt, call was unable to go through at this time. Unable to leave vm. Will try again later.

## 2021-12-17 NOTE — Progress Notes (Signed)
Hello Dr. Brigitte Pulse,   In regard to our mutual patient Mr. Ketcher, I would like to start Vascepa, but wanted to make sure are okay with this as well.   Thank you in advance,  Lawerance Cruel, PA-C

## 2021-12-17 NOTE — Progress Notes (Signed)
Patient stated that he will be willing to start Vascepa, if you could please first talk to Dr. Marton Redwood, MD get his input on it. He wasts to make sure both his cardiologist and his PCP are in agreement to him starting this new medication.

## 2022-01-11 ENCOUNTER — Other Ambulatory Visit: Payer: Self-pay

## 2022-01-11 ENCOUNTER — Ambulatory Visit (HOSPITAL_COMMUNITY)
Admission: RE | Admit: 2022-01-11 | Discharge: 2022-01-11 | Disposition: A | Discharge status: Home / Self Care | Payer: Medicare Other | Source: Ambulatory Visit | Attending: Internal Medicine | Admitting: Internal Medicine

## 2022-01-11 ENCOUNTER — Other Ambulatory Visit: Payer: Self-pay | Admitting: Internal Medicine

## 2022-01-11 ENCOUNTER — Other Ambulatory Visit (HOSPITAL_COMMUNITY): Payer: Self-pay | Admitting: Internal Medicine

## 2022-01-11 DIAGNOSIS — R071 Chest pain on breathing: Secondary | ICD-10-CM | POA: Diagnosis present

## 2022-01-11 DIAGNOSIS — R0602 Shortness of breath: Secondary | ICD-10-CM | POA: Diagnosis not present

## 2022-01-11 MED ORDER — IOHEXOL 350 MG/ML SOLN
80.0000 mL | Freq: Once | INTRAVENOUS | Status: AC | PRN
  Administered 2022-01-11: 80 mL via INTRAVENOUS

## 2022-03-02 IMAGING — PT NM PET TUM IMG INITIAL (PI) SKULL BASE T - THIGH
1 of 7 series · 1 of 25 positions shown · non-contrast
Comparison: CT neck 03/02/2021

CLINICAL DATA: Initial treatment strategy for right tongue cancer.

EXAM:
NUCLEAR MEDICINE PET SKULL BASE TO THIGH
TECHNIQUE: 14.2 mCi F-18 FDG was injected intravenously. Full-ring PET imaging
was performed from the skull base to thigh after the radiotracer. CT
data was obtained and used for attenuation correction and anatomic
localization.
Fasting blood glucose: 154 mg/dl

[Series 3: pet [id number] ac · axial · 5.0mm · 4.07mm/px · 1 of 248 slices shown]
[im 149/248]
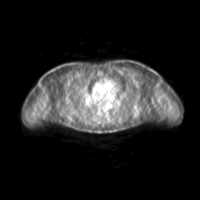

[1 of 25 positions shown; findings below may reference images not displayed]

FINDINGS: Mediastinal blood pool activity: SUV max

NECK:

There is focal hypermetabolic activity laterally in the right aspect
of the mid right tongue with an SUV max of 9.8. This hypermetabolic
activity extends nearly 4 cm in length on the sagittal images. No
hypermetabolic activity is seen within the adjacent mandible or
maxilla. No other lesions of the pharyngeal mucosal space are seen.
There are no enlarged or hypermetabolic cervical lymph nodes.

Incidental CT findings: Mild left carotid atherosclerosis.

CHEST:

There are no hypermetabolic mediastinal, hilar or axillary lymph
nodes. No hypermetabolic pulmonary activity or suspicious pulmonary
nodularity.

Incidental CT findings: Minimal aortic atherosclerosis. Mild mucous
plugging in the right lower lobe.

ABDOMEN/PELVIS:

There is no hypermetabolic activity within the liver, adrenal
glands, spleen or pancreas. There is no hypermetabolic nodal
activity. Mild anal activity within physiologic limits.

Incidental CT findings: Mild aortic and branch vessel
atherosclerosis.

SKELETON:

There is no hypermetabolic activity to suggest osseous metastatic
disease.

Incidental CT findings: none
IMPRESSION: 1. Focal hypermetabolic activity within the mid right tongue
consistent with known tongue cancer.
2. No cervical nodal metastases or other mucosal lesions identified.
3. No hypermetabolic activity within the chest, abdomen or pelvis.
4.  Aortic Atherosclerosis ([id number]).

## 2022-04-19 ENCOUNTER — Ambulatory Visit: Payer: Medicare Other | Admitting: Physician Assistant

## 2022-04-19 ENCOUNTER — Encounter: Payer: Self-pay | Admitting: Physician Assistant

## 2022-04-19 DIAGNOSIS — M654 Radial styloid tenosynovitis [de Quervain]: Secondary | ICD-10-CM | POA: Diagnosis not present

## 2022-04-19 MED ORDER — METHYLPREDNISOLONE ACETATE 40 MG/ML IJ SUSP
40.0000 mg | INTRAMUSCULAR | Status: AC | PRN
  Administered 2022-04-19: 40 mg

## 2022-04-19 MED ORDER — LIDOCAINE HCL 1 % IJ SOLN
1.0000 mL | INTRAMUSCULAR | Status: AC | PRN
  Administered 2022-04-19: 1 mL

## 2022-04-19 NOTE — Progress Notes (Signed)
   Procedure Note  Patient: [name] [name]             Date of Birth: [birth year]           MRN: [id number]             Visit Date: 04/19/2022  HPI: Mr. Kropf returns today for left wrist pain.  He was seen back on 12/07/2021 by Dr. Ninfa Linden and diagnosed with a left de Quervain's syndrome.  He was given the injection has helped until about 2 months ago the pain started coming back.  He had no new injury.  He did try the Voltaren gel and the wrist splint these were not very beneficial.  He is left-hand dominant.  He usually is activities such as pickleball gardening and writing.  He states he is not looking for surgery but would like some type of treatment today.  He is diabetic and reports his last hemoglobin A1c was 7.4.  Review of systems see HPI otherwise negative or noncontributory.  Physical exam: General well-developed well-nourished male in no acute distress Bilateral hands radial pulses 2+ and equal symmetric.  Sensation grossly intact bilateral hands light touch.  Positive Finkelstein's on the left negative on the right.  Tenderness over the left first extensor compartment.  Procedures: Visit Diagnoses:  1. Tenosynovitis, de Quervain     Hand/UE Inj for de Quervain's tenosynovitis on 04/19/2022 2:13 PM Indications: tendon swelling and pain Details: 25 G needle, radial approach Medications: 1 mL lidocaine 1 %; 40 mg methylPREDNISolone acetate 40 MG/ML Consent was given by the patient. Immediately prior to procedure a time out was called to verify the correct patient, procedure, equipment, support staff and site/side marked as required. Patient was prepped and draped in the usual sterile fashion.     Plan: Like for him to wear his wrist splint with a thumb spica for the next 2 weeks whenever he is up he does not think it work better.  He will follow-up with [place] as needed.  Questions were encouraged and answered at length.

## 2022-06-07 ENCOUNTER — Ambulatory Visit: Payer: Medicare Other | Admitting: Student

## 2022-06-14 ENCOUNTER — Encounter: Payer: Self-pay | Admitting: Student

## 2022-06-14 ENCOUNTER — Ambulatory Visit: Payer: Medicare Other | Admitting: Student

## 2022-06-14 VITALS — BP 108/70 | HR 97 | Temp 98.2°F | Resp 16 | Ht 73.0 in | Wt 290.0 lb

## 2022-06-14 DIAGNOSIS — I447 Left bundle-branch block, unspecified: Secondary | ICD-10-CM

## 2022-06-14 DIAGNOSIS — E7800 Pure hypercholesterolemia, unspecified: Secondary | ICD-10-CM

## 2022-06-14 DIAGNOSIS — I10 Essential (primary) hypertension: Secondary | ICD-10-CM

## 2022-06-14 MED ORDER — ROSUVASTATIN CALCIUM 20 MG PO TABS: 20.0000 mg | ORAL_TABLET | Freq: Every evening | ORAL | 3 refills | Status: DC

## 2022-06-14 NOTE — Progress Notes (Signed)
Primary Physician/Referring:  Ginger Organ., MD  Patient ID: [name] [name], male    DOB: Jun 10, 1956, 66 y.o.   MRN: [id number]  Chief Complaint  Patient presents with   Hypertension   Follow-up    6 month   HPI:    [name] [name]  is a 66 y.o. Caucasian male with hypertension, hyperlipidemia, diabetes mellitus, obstructive sleep apnea on CPAP, left bundle branch block who had seen a month ago when he presented with abnormal EKG, fatigue and left arm tingling and numbness which improved with nitroglycerin.  Patient therefore underwent nuclear stress test 10/2020 which was high risk given perfusion defect suggestive of infarct.  Patient underwent echocardiogram 12/2020 which revealed low normal LVEF at 50-55% and left bundle branch block, otherwise no significant abnormalities.  Patient was last seen in the office 12/07/2021 at which time no changes were made, however did request repeat lipid profile testing given hypertriglyceridemia.  Personally reviewed external labs, lipids remain uncontrolled with triglycerides 179, LDL 99. He now presents for 6 month follow up.  Since last office visit patient has inadvertently discontinued aspirin.  He is feeling well overall but admits to dietary and medication noncompliance, struggling to take rosuvastatin on a regular basis.  Denies chest pain, palpitations, dyspnea, syncope, near syncope.  Past Medical History:  Diagnosis Date   Arthritis    KNEES AND NECK; TORN MENISCUS RT KNEE   Chest pain, atypical    Diabetes mellitus    ORAL MEDS   ED (erectile dysfunction)    GERD (gastroesophageal reflux disease)    H/O bee sting allergy    History of kidney stones    Hyperlipidemia    Hypertension    LBBB (left bundle branch block)    Lichen planus    OF MOUTH   Obesity    OSA (obstructive sleep apnea)    USES CPAP - DOES NOT KNOW SETTING   Sleep apnea    wears CPAP   Past Surgical History:  Procedure Laterality Date   ANAL FISSURE  REPAIR     CARDIAC CATHETERIZATION N/A 04/25/2015   Procedure: Left Heart Cath and Coronary Angiography;  Surgeon: Peter M Martinique, MD;  Location: [facility];  Service: Cardiovascular;  Laterality: N/A;   CARDIOVASCULAR STRESS TEST  01/16/2010   EF 54%   COLONOSCOPY     KNEE ARTHROSCOPY Right 05/10/2014   Procedure: RIGHT KNEE ARTHROSCOPY WITH PARTIAL MEDIAL MENISCECTOMY;  Surgeon: Mcarthur Rossetti, MD;  Location: [facility];  Service: Orthopedics;  Laterality: Right;   oral cancer  02/2021   partial tongue   SINUS SURGERY WITH INSTATRAK     US ECHOCARDIOGRAPHY  09/19/2007   EF 55-60%   VASECTOMY     Family History  Problem Relation Age of Onset   Hypertension Mother    Heart disease Mother    Rheum arthritis Mother    Kidney failure Father    Kidney disease Father    Heart disease Father    Colon cancer Neg Hx    Esophageal cancer Neg Hx    Stomach cancer Neg Hx    Rectal cancer Neg Hx     Social History   Tobacco Use   Smoking status: Never   Smokeless tobacco: Never  Substance Use Topics   Alcohol use: No    Alcohol/week: 0.0 standard drinks of alcohol   Marital Status: Married  ROS  Review of Systems  Constitutional: Negative for weight gain.  Cardiovascular:  Negative for  chest pain, claudication, dyspnea on exertion, leg swelling, near-syncope, orthopnea, palpitations, paroxysmal nocturnal dyspnea and syncope.  Respiratory:  Positive for snoring. Negative for shortness of breath.   Hematologic/Lymphatic: Does not bruise/bleed easily.  Musculoskeletal:  Positive for joint pain and neck pain.  All other systems reviewed and are negative.  Objective  Blood pressure 108/70, pulse 97, temperature 98.2 F (36.8 C), resp. rate 16, height _0  (1.854 m), weight 290 lb (131.5 kg), SpO2 97 %.     06/14/2022    2:21 PM 12/07/2021    2:51 PM 07/27/2021    7:17 AM  Vitals with BMI  Height _1  _2  _3   Weight 290 lbs 289 lbs 285 lbs 13 oz  BMI 38.27 63.89  37.34  Systolic 287 681 157  Diastolic 70 75 73  Pulse 97 84 71     Physical Exam Vitals reviewed.  Constitutional:      General: He is not in acute distress.    Appearance: He is obese.     Comments: Morbidly obese in no acute distress.  Cardiovascular:     Rate and Rhythm: Normal rate and regular rhythm.     Pulses:          Carotid pulses are 2+ on the right side and 2+ on the left side.      Popliteal pulses are 2+ on the right side and 2+ on the left side.       Dorsalis pedis pulses are 2+ on the right side and 2+ on the left side.       Posterior tibial pulses are 2+ on the right side and 2+ on the left side.     Heart sounds: Normal heart sounds. No murmur heard.    No gallop.     Comments: Femoral pulse difficult to feel due to patient's body habitus.  JVD difficult to see due to short neck. Pulmonary:     Effort: Pulmonary effort is normal.     Breath sounds: Normal breath sounds.  Abdominal:     Comments: Obese. Pannus present  Musculoskeletal:     Right lower leg: No edema.     Left lower leg: No edema.  Neurological:     Mental Status: He is alert.   Physical exam unchanged compared to previous office visit.   Laboratory examination:      Latest Ref Rng & Units 03/11/2021   10:29 AM 04/21/2015    8:56 AM 05/07/2014    3:00 PM  CMP  Glucose 70 - 99 mg/dL 138  160  214   BUN 8 - 23 mg/dL _4 Creatinine 0.61 - 1.24 mg/dL 0.88  0.75  0.73   Sodium 135 - 145 mmol/L 137  138  137   Potassium 3.5 - 5.1 mmol/L 3.8  4.5  4.3   Chloride 98 - 111 mmol/L 102  99  97   CO2 22 - 32 mmol/L _5 Calcium 8.9 - 10.3 mg/dL 9.5  9.4  9.5       Latest Ref Rng & Units 03/11/2021   10:29 AM 04/21/2015   11:33 AM 05/07/2014    3:00 PM  CBC  WBC 4.0 - 10.5 K/uL 5.6  5.8  6.3   Hemoglobin 13.0 - 17.0 g/dL 14.6  15.5  14.3   Hematocrit 39.0 - 52.0 % 42.8  45.7  40.9   Platelets 150 - 400 K/uL 227  252  234    Lipid Panel  No results found for: "CHOL",  "TRIG", "HDL", "CHOLHDL", "VLDL", "LDLCALC", "LDLDIRECT" HEMOGLOBIN A1C No results found for: "HGBA1C", "MPG" TSH No results for input(s): "TSH" in the last 8760 hours.  External labs:  04/05/2022: Total cholesterol 170, triglycerides 179, HDL 35, LDL 99 TSH 1.48 A1c 7.4% Hgb 14.6, HCT 39.9, platelet 181 BUN 21, creatinine 0.7, GFR >60, sodium 135, potassium 4.2  04/20/2021:  Glucose 170, BUN 13, creatinine 0.80, GFR 96.4, sodium 131, potassium 3.7, AST 17, ALT 29, alk phos 95 Hemoglobin 14.8, hematocrit 43.8, MCV 89.6, platelet 274  03/19/2021: Total cholesterol 135, triglycerides 196, HDL 33, LDL 63 TSH 1.61 A1c 6.7%.  09/09/2020: Total cholesterol 180, HDL 42, LDL 116, triglycerides 140 A1c 6.3%  Allergies   Allergies  Allergen Reactions   Bee Venom Anaphylaxis   Ezetimibe     Other reaction(s): Unknown   Insulin Degludec-Liraglutide     Other reaction(s): local skin reaction   Other     Other reaction(s): myalgia   Bydureon [Exenatide] Rash   Penicillins Hives and Rash    Medications Prior to Visit:   Outpatient Medications Prior to Visit  Medication Sig Dispense Refill   Cholecalciferol (DIALYVITE VITAMIN D 5000) 125 MCG (5000 UT) capsule Take 5,000 Units by mouth daily.     diphenhydramine-acetaminophen (TYLENOL PM EXTRA STRENGTH) 25-500 MG TABS tablet 1 tablet at bedtime as needed     Dulaglutide 1.5 MG/0.5ML SOPN Inject 1.5 mg into the skin once a week.     ELDERBERRY PO Take 1 capsule by mouth daily.     EPINEPHrine 0.3 mg/0.3 mL IJ SOAJ injection Inject 0.3 mg into the muscle as needed for anaphylaxis.     metoprolol succinate (TOPROL-XL) 25 MG 24 hr tablet Take 1 tablet (25 mg total) by mouth daily. (Patient taking differently: Take 50 mg by mouth daily.) 30 tablet 3   Misc Natural Products (TURMERIC CURCUMIN) CAPS Take 1 capsule by mouth daily.     Multiple Vitamin (MULTIVITAMIN) tablet Take 1 tablet by mouth daily.     omeprazole (PRILOSEC) 20 MG capsule  Take 20 mg by mouth every other day.     sertraline (ZOLOFT) 50 MG tablet Take 25 mg by mouth daily.     tadalafil (CIALIS) 20 MG tablet Take 20 mg by mouth daily as needed for erectile dysfunction.     tamsulosin (FLOMAX) 0.4 MG CAPS capsule Take 0.4 mg by mouth daily.     telmisartan-hydrochlorothiazide (MICARDIS HCT) 80-25 MG tablet Take 1 tablet by mouth daily.     traMADol (ULTRAM) 50 MG tablet Take 50 mg by mouth every 6 (six) hours as needed for moderate pain.     zinc gluconate 50 MG tablet Take 50 mg by mouth daily.     rosuvastatin (CRESTOR) 10 MG tablet Take 10 mg by mouth every other day.     aspirin EC 81 MG tablet Take 81 mg by mouth daily. Swallow whole. (Patient not taking: Reported on 12/07/2021)     No facility-administered medications prior to visit.   Final Medications at End of Visit    Current Meds  Medication Sig   Cholecalciferol (DIALYVITE VITAMIN D 5000) 125 MCG (5000 UT) capsule Take 5,000 Units by mouth daily.   diphenhydramine-acetaminophen (TYLENOL PM EXTRA STRENGTH) 25-500 MG TABS tablet 1 tablet at bedtime as needed   Dulaglutide 1.5 MG/0.5ML SOPN Inject 1.5 mg into the skin once a week.   ELDERBERRY PO Take  1 capsule by mouth daily.   EPINEPHrine 0.3 mg/0.3 mL IJ SOAJ injection Inject 0.3 mg into the muscle as needed for anaphylaxis.   metoprolol succinate (TOPROL-XL) 25 MG 24 hr tablet Take 1 tablet (25 mg total) by mouth daily. (Patient taking differently: Take 50 mg by mouth daily.)   Misc Natural Products (TURMERIC CURCUMIN) CAPS Take 1 capsule by mouth daily.   Multiple Vitamin (MULTIVITAMIN) tablet Take 1 tablet by mouth daily.   omeprazole (PRILOSEC) 20 MG capsule Take 20 mg by mouth every other day.   sertraline (ZOLOFT) 50 MG tablet Take 25 mg by mouth daily.   tadalafil (CIALIS) 20 MG tablet Take 20 mg by mouth daily as needed for erectile dysfunction.   tamsulosin (FLOMAX) 0.4 MG CAPS capsule Take 0.4 mg by mouth daily.    telmisartan-hydrochlorothiazide (MICARDIS HCT) 80-25 MG tablet Take 1 tablet by mouth daily.   traMADol (ULTRAM) 50 MG tablet Take 50 mg by mouth every 6 (six) hours as needed for moderate pain.   zinc gluconate 50 MG tablet Take 50 mg by mouth daily.   [DISCONTINUED] rosuvastatin (CRESTOR) 10 MG tablet Take 10 mg by mouth every other day.   Radiology:   Chest x-ray 04/22/2015: The lungs are adequately inflated and clear. The heart is top-normal in size but stable. The pulmonary vascularity is within the limits of normal. There is no pleural effusion. The mediastinum is normal in width. The bony thorax exhibits no acute abnormality.   IMPRESSION: There is no active cardiopulmonary disease.  Cardiac Studies:   Myocardial perfusion scan 04/17/2015: Myocardial perfusion is abnormal. Moderate sized and intensity, reversible anterior, anteroseptal, septal and apical defect. Findings consistent with ischemia. LBBB is present and can also lead to septal defect, but this is usually a fixed defect. Ischemia is favored. This is an intermediate risk study. Overall left ventricular systolic function was abnormal. LV cavity size is normal. The left ventricular ejection fraction is mildly decreased (50%). There is no prior study for comparison.  Cardiac catheterization 04/25/2015: Right dominant circulation, normal coronary arteries.  Normal LV systolic function, EF 55 to 65%.    Lexiscan Tetrofosmin stress test 11/24/2020: Lexiscan nuclear stress test performed using 1-day protocol. SPECT images show large sized severe intensity, predominantly fixed perfusion defect in apical to basal inferior/inferolateral, and basal inferoseptal myocardium, with corresponding akinesis suggestive of infarct. Stress LVEF 34%. High risk study.   Compared to 04/17/2015, anterior ischemia was noted previously, patient does have a history of normal coronary angiography in 2016.  Echocardiogram 01/03/2021: Study Quality:  Technically difficult study. Normal LV systolic function with visual EF 50-55%. Abnormal septal wall motion due to left bundle branch block. Left ventricle cavity is normal in size. Normal global wall motion. Normal diastolic filling pattern, normal LAP.  No significant valvular heart disease. No prior study for comparison.  EKG:  06/14/2022: Sinus rhythm at a rate of 90 bpm.  Left bundle branch block, no further analysis.  Compared EKG 12/07/2021, no significant change.  11/18/2020: Sinus tachycardia at rate of 100 bpm, left atrial enlargement, left bundle branch block.  No further analysis.  No change compared to 11/14/2020.  No change in left bundle branch block, compared to 03/27/2014.  Heart rate was 90 bpm.  Assessment     ICD-10-CM   1. Essential hypertension  I10 EKG 12-Lead    2. Hypercholesteremia  E78.00 Lipid Panel With LDL/HDL Ratio    3. LBBB (left bundle branch block)  I44.7  Medications Discontinued During This Encounter  Medication Reason   aspirin EC 81 MG tablet    rosuvastatin (CRESTOR) 10 MG tablet Reorder     Meds ordered this encounter  Medications   rosuvastatin (CRESTOR) 20 MG tablet    Sig: Take 1 tablet (20 mg total) by mouth at bedtime.    Dispense:  30 tablet    Refill:  3   Orders Placed This Encounter  Procedures   Lipid Panel With LDL/HDL Ratio   EKG 12-Lead    Recommendations:   [name] [name] is a 66 y.o. Caucasian male with hypertension, hyperlipidemia, diabetes mellitus, obstructive sleep apnea on CPAP, left bundle branch block who had seen a month ago when he presented with abnormal EKG, fatigue and left arm tingling and numbness which improved with nitroglycerin. Patient therefore underwent nuclear stress test 10/2020 which was high risk given perfusion defect suggestive of infarct.  Patient underwent echocardiogram 12/2020 which revealed low normal LVEF at 50-55% and left bundle branch block, otherwise no significant  abnormalities.  Patient was last seen in the office 12/07/2021 at which time no changes were made, however did request repeat lipid profile testing given hypertriglyceridemia.  Personally reviewed external labs, lipids remain uncontrolled with triglycerides 179, LDL 99. He now presents for 6 month follow up. I personally reviewed external labs. Will increase rosuvastatin to 20 mg nightly and repeat lipid profile testing in 8 weeks. If patient develops side effects, could consider reducing rosuvastatin back to 10 mg daily and add Zetia. LDL goal < 70. Will restart aspirin 81 mg daily given history of high risk stress test. He however remains asymptomatic, will continue medical management.   Discussed with patient regarding diet and lifestyle modifications.  Blood pressure is well controlled.  Follow-up in 6 months, sooner if needed.   This was a 45-minute encounter with face-to-face counseling, medical records review, coordination of care, explanation of complex medical issues, complex medical decision making.     [name] Berthold, PA-C 06/14/2022, 3:03 PM Office: [phone]

## 2022-06-18 ENCOUNTER — Telehealth: Payer: Self-pay

## 2022-06-18 NOTE — Telephone Encounter (Signed)
VM 1110: Patient called and left a message.  Message is as follows:  "Yes [name] [name], I need to get a message to [facility] an important message please my name's [name] [name] birthday 1956-07-15 a patient of y'all I had an appointment there last Monday at 2:15 I have about 203 follow up questions I need to go over with Celeste I called yesterday and left a message but did not receive a phone call I would like to be called back today please my number is [phone] again the number [phone] I need Celeste you call me back thank you bye bye"

## 2022-06-18 NOTE — Telephone Encounter (Signed)
Spoke to patient over the phone and again reviewed stress test and echo. His questions were addressed to his satisfaction

## 2022-06-30 ENCOUNTER — Other Ambulatory Visit: Payer: Self-pay | Admitting: Internal Medicine

## 2022-06-30 ENCOUNTER — Other Ambulatory Visit: Payer: Self-pay | Admitting: Geriatric Medicine

## 2022-06-30 DIAGNOSIS — R911 Solitary pulmonary nodule: Secondary | ICD-10-CM

## 2022-07-27 ENCOUNTER — Ambulatory Visit
Admission: RE | Admit: 2022-07-27 | Discharge: 2022-07-27 | Disposition: A | Discharge status: Home / Self Care | Payer: Medicare Other | Source: Ambulatory Visit | Attending: Internal Medicine | Admitting: Internal Medicine

## 2022-07-27 ENCOUNTER — Ambulatory Visit: Payer: Medicare Other | Admitting: Family Medicine

## 2022-07-27 DIAGNOSIS — R911 Solitary pulmonary nodule: Secondary | ICD-10-CM

## 2022-08-26 NOTE — Patient Instructions (Addendum)
Please continue using your CPAP regularly. While your insurance requires that you use CPAP at least 4 hours each night on 70% of the nights, I recommend, that you not skip any nights and use it throughout the night if you can. Getting used to CPAP and staying with the treatment long term does take time and patience and discipline. Untreated obstructive sleep apnea when it is moderate to severe can have an adverse impact on cardiovascular health and raise her risk for heart disease, arrhythmias, hypertension, congestive heart failure, stroke and diabetes. Untreated obstructive sleep apnea causes sleep disruption, nonrestorative sleep, and sleep deprivation. This can have an impact on your day to day functioning and cause daytime sleepiness and impairment of cognitive function, memory loss, mood disturbance, and problems focussing. Using CPAP regularly can improve these symptoms.  Please continue to monitor for leak. Change out mask regularly.   Follow up in 1 year

## 2022-08-26 NOTE — Progress Notes (Signed)
PATIENT: [name] [name] DOB: [birth year]  REASON FOR VISIT: follow up HISTORY FROM: patient  Chief Complaint  Patient presents with   Follow-up    Pt in room #2 and alone. Pt here today for CPAP f/u.     HISTORY OF PRESENT ILLNESS:  08/31/22 ALL:  [name] [name] is a 66 y.o. male here today for follow up for OSA on CPAP.  He is doing very well. He is using CPAP nightly for about 9-10 hours. He can not sleep without therapy. He admits that he may extend mask life longer than 30 days. He also has facial hair that most likely contributes to leak.     HISTORY: (copied from Dr Guadelupe Sabin previous note)  [name] [name] is a 66 year old left-handed gentleman with an underlying medical history of diabetes, hypertension, hyperlipidemia, ED, lichen planus, SCC of the tongue with s/p partial glossectomy in 02/2021, and obesity, who presents for follow-up consultation of his obstructive sleep apnea after interim home sleep testing and starting AutoPap therapy.  The patient is unaccompanied today. He missed an appointment on 06/25/21. I last saw him on 12/30/2020 for re-evaluation of his sleep apnea at the request of his cardiologist.  He was advised to proceed with a sleep study.  He had a home sleep test on 02/02/2021 which indicated overall mild sleep apnea with an AHI of 9/h, O2 nadir 87%.  We did not have records from his old CPAP machine.  He was advised to start AutoPap therapy.  His set up date was 03/19/2021.   Today, 07/27/2021: I reviewed his AutoPap compliance data from 06/23/2021 through 07/22/2021 which is a total of 30 days, during which time he used his machine 21 days with percent used days greater than 4 hours at 70%, indicating adequate compliance with an average usage of 9 hours and 24 minutes, residual AHI at goal at 1.1/h, average pressure for the 95th percentile at 9.2 cm with a range of 6 to 12 cm with EPR, 95th percentile of pressure elevated at 37.9 L/min.     I reviewed his AutoPap  compliance data from 05/25/2021 through 06/23/2021, which is a total of 30 days, during which time he used his machine 29 days with percent use days greater than 4 hours at 97%, indicating excellent compliance with an average usage of 9 hours and 27 minutes, residual AHI at goal at 1.3/h, average pressure for the 95th percentile at 9 cm with a range of 6 to 12 cm with EPR, leak on the higher side with a 95th percentile at 38.2 L/min.  He reports that he has adjusted well to his AutoPap.  He had a gap in his compliance in late July and first week of August.  He reports that he used his old machine on vacation and kept his new machine at home.  He has generally been fully compliant and this is visible on compliance report as well.  I reviewed his 90-day compliance as well and he has been excellent with his compliance.  He is adjusting to his current surgery.  He has no significant pain but has some numbness in that tongue area and the right posterior area of his mouth and sometimes food is lodged in there and he does not notice it until it moves.  Of note, he was diagnosed with squamous cell carcinoma of the tongue and is now status post partial glossectomy since April 2022.  He uses a full facemask.  He reports that given his lichen  planus he was not able to use a nasal mask as the area inside his mouth where the nasal mask hit the upper lip became irritated in the past.  He has noticed a leak from the mass.  He reports that he may not be tightening it well enough.   REVIEW OF SYSTEMS: Out of a complete 14 system review of symptoms, the patient complains only of the following symptoms, none and all other reviewed systems are negative.  ESS: 4/24, previously 4/24  ALLERGIES: Allergies  Allergen Reactions   Bee Venom Anaphylaxis   Ezetimibe     Other reaction(s): Unknown   Insulin Degludec-Liraglutide     Other reaction(s): local skin reaction   Other     Other reaction(s): myalgia   Bydureon [Exenatide]  Rash   Penicillins Hives and Rash    HOME MEDICATIONS: Outpatient Medications Prior to Visit  Medication Sig Dispense Refill   Cholecalciferol (DIALYVITE VITAMIN D 5000) 125 MCG (5000 UT) capsule Take 5,000 Units by mouth daily.     diphenhydramine-acetaminophen (TYLENOL PM EXTRA STRENGTH) 25-500 MG TABS tablet 1 tablet at bedtime as needed     Dulaglutide 1.5 MG/0.5ML SOPN Inject 1.5 mg into the skin once a week.     ELDERBERRY PO Take 1 capsule by mouth daily.     EPINEPHrine 0.3 mg/0.3 mL IJ SOAJ injection Inject 0.3 mg into the muscle as needed for anaphylaxis.     metoprolol succinate (TOPROL-XL) 25 MG 24 hr tablet Take 1 tablet (25 mg total) by mouth daily. (Patient taking differently: Take 50 mg by mouth daily.) 30 tablet 3   Misc Natural Products (TURMERIC CURCUMIN) CAPS Take 1 capsule by mouth daily.     Multiple Vitamin (MULTIVITAMIN) tablet Take 1 tablet by mouth daily.     omeprazole (PRILOSEC) 20 MG capsule Take 20 mg by mouth every other day.     rosuvastatin (CRESTOR) 20 MG tablet Take 1 tablet (20 mg total) by mouth at bedtime. 30 tablet 3   sertraline (ZOLOFT) 50 MG tablet Take 25 mg by mouth daily.     tadalafil (CIALIS) 20 MG tablet Take 20 mg by mouth daily as needed for erectile dysfunction.     tamsulosin (FLOMAX) 0.4 MG CAPS capsule Take 0.4 mg by mouth daily.     telmisartan-hydrochlorothiazide (MICARDIS HCT) 80-25 MG tablet Take 1 tablet by mouth daily.     traMADol (ULTRAM) 50 MG tablet Take 50 mg by mouth every 6 (six) hours as needed for moderate pain.     zinc gluconate 50 MG tablet Take 50 mg by mouth daily.     No facility-administered medications prior to visit.    PAST MEDICAL HISTORY: Past Medical History:  Diagnosis Date   Arthritis    KNEES AND NECK; TORN MENISCUS RT KNEE   Chest pain, atypical    Diabetes mellitus    ORAL MEDS   ED (erectile dysfunction)    GERD (gastroesophageal reflux disease)    H/O bee sting allergy    History of kidney  stones    Hyperlipidemia    Hypertension    LBBB (left bundle branch block)    Lichen planus    OF MOUTH   Obesity    OSA (obstructive sleep apnea)    USES CPAP - DOES NOT KNOW SETTING   Sleep apnea    wears CPAP    PAST SURGICAL HISTORY: Past Surgical History:  Procedure Laterality Date   ANAL FISSURE REPAIR  CARDIAC CATHETERIZATION N/A 04/25/2015   Procedure: Left Heart Cath and Coronary Angiography;  Surgeon: Peter M Martinique, MD;  Location: [facility];  Service: Cardiovascular;  Laterality: N/A;   CARDIOVASCULAR STRESS TEST  01/16/2010   EF 54%   COLONOSCOPY     KNEE ARTHROSCOPY Right 05/10/2014   Procedure: RIGHT KNEE ARTHROSCOPY WITH PARTIAL MEDIAL MENISCECTOMY;  Surgeon: Mcarthur Rossetti, MD;  Location: [facility];  Service: Orthopedics;  Laterality: Right;   oral cancer  02/2021   partial tongue   SINUS SURGERY WITH INSTATRAK     US ECHOCARDIOGRAPHY  09/19/2007   EF 55-60%   VASECTOMY      FAMILY HISTORY: Family History  Problem Relation Age of Onset   Hypertension Mother    Heart disease Mother    Rheum arthritis Mother    Kidney failure Father    Kidney disease Father    Heart disease Father    Colon cancer Neg Hx    Esophageal cancer Neg Hx    Stomach cancer Neg Hx    Rectal cancer Neg Hx     SOCIAL HISTORY: Social History   Socioeconomic History   Marital status: Married    Spouse name: Pamala Hurry   Number of children: 2   Years of education: Not on file   Highest education level: Not on file  Occupational History   Occupation: Press photographer Rep   Tobacco Use   Smoking status: Never   Smokeless tobacco: Never  Vaping Use   Vaping Use: Never used  Substance and Sexual Activity   Alcohol use: No    Alcohol/week: 0.0 standard drinks of alcohol   Drug use: No   Sexual activity: Yes  Other Topics Concern   Not on file  Social History Narrative   Not on file   Social Determinants of Health   Financial Resource Strain: Not on file  Food  Insecurity: Not on file  Transportation Needs: Not on file  Physical Activity: Not on file  Stress: Not on file  Social Connections: Not on file  Intimate Partner Violence: Not on file     PHYSICAL EXAM  Vitals:   08/31/22 1042  BP: (!) 146/88  Pulse: 88  Weight: 294 lb (133.4 kg)  Height: 6' (1.829 m)   Body mass index is 39.87 kg/m.  Generalized: Well developed, in no acute distress  Cardiology: normal rate and rhythm, no murmur noted Respiratory: clear to auscultation bilaterally  Neurological examination  Mentation: Alert oriented to time, place, history taking. Follows all commands speech and language fluent Cranial nerve II-XII: Pupils were equal round reactive to light. Extraocular movements were full, visual field were full  Motor: The motor testing reveals 5 over 5 strength of all 4 extremities. Good symmetric motor tone is noted throughout.  Gait and station: Gait is normal.    DIAGNOSTIC DATA (LABS, IMAGING, TESTING) - I reviewed patient records, labs, notes, testing and imaging myself where available.      No data to display           Lab Results  Component Value Date   WBC 5.6 03/11/2021   HGB 14.6 03/11/2021   HCT 42.8 03/11/2021   MCV 88.6 03/11/2021   PLT 227 03/11/2021      Component Value Date/Time   NA 137 03/11/2021 1029   K 3.8 03/11/2021 1029   CL 102 03/11/2021 1029   CO2 27 03/11/2021 1029   GLUCOSE 138 (H) 03/11/2021 1029   BUN 13  03/11/2021 1029   CREATININE 0.88 03/11/2021 1029   CREATININE 0.75 04/21/2015 0856   CALCIUM 9.5 03/11/2021 1029   GFRNONAA >60 03/11/2021 1029   GFRAA >90 05/07/2014 1500   No results found for: "CHOL", "HDL", "LDLCALC", "LDLDIRECT", "TRIG", "CHOLHDL" No results found for: "HGBA1C" No results found for: "VITAMINB12" No results found for: "TSH"   ASSESSMENT AND PLAN 66 y.o. year old male  has a past medical history of Arthritis, Chest pain, atypical, Diabetes mellitus, ED (erectile dysfunction),  GERD (gastroesophageal reflux disease), H/O bee sting allergy, History of kidney stones, Hyperlipidemia, Hypertension, LBBB (left bundle branch block), Lichen planus, Obesity, OSA (obstructive sleep apnea), and Sleep apnea. here with     ICD-10-CM   1. OSA on CPAP  G47.33 For home use only DME continuous positive airway pressure (CPAP)        [name] [name] is doing well on CPAP therapy. Compliance report reveals xcellent compliance. He was encouraged to continue using CPAP nightly and for greater than 4 hours each night. We will update supply orders as indicated. He will continue to montior for leak at home and change mask out regularly. Risks of untreated sleep apnea review and education materials provided. Healthy lifestyle habits encouraged. He will follow up in 1 year, sooner if needed. He verbalizes understanding and agreement with this plan.    Orders Placed This Encounter  Procedures   For home use only DME continuous positive airway pressure (CPAP)    Supplies    Order Specific Question:   Length of Need    Answer:   Lifetime    Order Specific Question:   Patient has OSA or probable OSA    Answer:   Yes    Order Specific Question:   Is the patient currently using CPAP in the home    Answer:   Yes    Order Specific Question:   Settings    Answer:   Other see comments    Order Specific Question:   CPAP supplies needed    Answer:   Mask, headgear, cushions, filters, heated tubing and water chamber     No orders of the defined types were placed in this encounter.     Debbora Presto, FNP-C 08/31/2022, 11:02 AM [facility] [address] [place], [place] [address] [phone]

## 2022-08-31 ENCOUNTER — Encounter: Payer: Self-pay | Admitting: Family Medicine

## 2022-08-31 ENCOUNTER — Ambulatory Visit: Payer: Medicare Other | Admitting: Family Medicine

## 2022-08-31 VITALS — BP 146/88 | HR 88 | Ht 72.0 in | Wt 294.0 lb

## 2022-08-31 DIAGNOSIS — G4733 Obstructive sleep apnea (adult) (pediatric): Secondary | ICD-10-CM | POA: Diagnosis not present

## 2022-09-01 ENCOUNTER — Telehealth: Payer: Self-pay

## 2022-12-15 ENCOUNTER — Ambulatory Visit: Payer: Medicare Other | Admitting: Cardiology

## 2022-12-27 ENCOUNTER — Ambulatory Visit: Payer: Medicare Other | Admitting: Cardiology

## 2022-12-27 ENCOUNTER — Encounter: Payer: Self-pay | Admitting: Cardiology

## 2022-12-27 VITALS — BP 149/74 | HR 79 | Resp 16 | Ht 72.0 in | Wt 305.4 lb

## 2022-12-27 DIAGNOSIS — I5032 Chronic diastolic (congestive) heart failure: Secondary | ICD-10-CM

## 2022-12-27 DIAGNOSIS — I10 Essential (primary) hypertension: Secondary | ICD-10-CM

## 2022-12-27 DIAGNOSIS — E7800 Pure hypercholesterolemia, unspecified: Secondary | ICD-10-CM

## 2022-12-27 DIAGNOSIS — I447 Left bundle-branch block, unspecified: Secondary | ICD-10-CM

## 2022-12-27 NOTE — Progress Notes (Signed)
Primary Physician/Referring:  Ginger Organ., MD  Patient ID: [name] [name], male    DOB: [birth year], 67 y.o.   MRN: [id number]  Chief Complaint  Patient presents with   Hypertension   Hyperlipidemia   Follow-up    6 months   HPI:    [name] [name]  is a 67 y.o. Caucasian male with hypertension, hyperlipidemia, diabetes mellitus, obstructive sleep apnea on CPAP, chronic left bundle branch block and chronic diastolic heart failure and morbid obesity with OSA on CPAP. This is an annual visit.  This is a 24-monthoffice visit.  Patient states that he is presently doing well.  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  He is being closely followed by ENT.  Otherwise he remains compliant with CPAP, no other specific complaints. No chest pain, dyspnea, palpitations, dizziness or syncope.   Past Medical History:  Diagnosis Date   Arthritis    KNEES AND NECK; TORN MENISCUS RT KNEE   Chest pain, atypical    Diabetes mellitus    ORAL MEDS   ED (erectile dysfunction)    GERD (gastroesophageal reflux disease)    H/O bee sting allergy    History of kidney stones    Hyperlipidemia    Hypertension    LBBB (left bundle branch block)    Lichen planus    OF MOUTH   Obesity    OSA (obstructive sleep apnea)    USES CPAP - DOES NOT KNOW SETTING   Sleep apnea    wears CPAP   Past Surgical History:  Procedure Laterality Date   ANAL FISSURE REPAIR     CARDIAC CATHETERIZATION N/A 04/25/2015   Procedure: Left Heart Cath and Coronary Angiography;  Surgeon: Peter M JMartinique MD;  [facility];  Service: Cardiovascular;  Laterality: N/A;   CARDIOVASCULAR STRESS TEST  01/16/2010   EF 54%   COLONOSCOPY     KNEE ARTHROSCOPY Right 05/10/2014   Procedure: RIGHT KNEE ARTHROSCOPY WITH PARTIAL MEDIAL MENISCECTOMY;  Surgeon: CMcarthur Rossetti MD;  Location: [facility];  Service: Orthopedics;  Laterality: Right;    oral cancer  02/2021   partial tongue   SINUS SURGERY WITH INSTATRAK     [place]  09/19/2007   EF 55-60%   VASECTOMY     Family History  Problem Relation Age of Onset   Hypertension Mother    Heart disease Mother    Rheum arthritis Mother    Kidney failure Father    Kidney disease Father    Heart disease Father    Colon cancer Neg Hx    Esophageal cancer Neg Hx    Stomach cancer Neg Hx    Rectal cancer Neg Hx     Social History   Tobacco Use   Smoking status: Never   Smokeless tobacco: Never  Substance Use Topics   Alcohol use: No    Alcohol/week: 0.0 standard drinks of alcohol   Marital Status: Married  ROS  Review of Systems  Cardiovascular:  Negative for chest pain, dyspnea on exertion and leg swelling.  Respiratory:  Positive for snoring (on CPAP).    Objective  Blood pressure (!) 149/74, pulse 79, resp. rate 16, height 6' (1.829 m), weight (!) 305 lb 6.4 oz (138.5 kg), SpO2 98 %.     12/27/2022   11:45 AM 08/31/2022   10:42 AM 06/14/2022    2:21 PM  Vitals  with BMI  Height '6\' 0"'$  '6\' 0"'$  '6\' 1"'$   Weight 305 lbs 6 oz 294 lbs 290 lbs  BMI 41.41 65.03 54.65  Systolic 681 275 170  Diastolic 74 88 70  Pulse 79 88 97     Physical Exam Vitals reviewed.  Constitutional:      Appearance: He is morbidly obese.     Comments: Morbidly obese in no acute distress.  Neck:     Vascular: No carotid bruit or JVD.  Cardiovascular:     Rate and Rhythm: Normal rate and regular rhythm.     Pulses: Intact distal pulses.          Dorsalis pedis pulses are 2+ on the right side and 2+ on the left side.       Posterior tibial pulses are 2+ on the right side and 2+ on the left side.     Heart sounds: Normal heart sounds. No murmur heard.    No gallop.  Pulmonary:     Effort: Pulmonary effort is normal.     Breath sounds: Normal breath sounds.  Abdominal:     General: Bowel sounds are normal.     Palpations: Abdomen is soft.  Musculoskeletal:     Right lower leg:  No edema.     Left lower leg: No edema.   Physical exam unchanged compared to previous office visit.   Laboratory examination:   External labs:   04/05/2022: Total cholesterol 170, triglycerides 179, HDL 35, LDL 99 TSH 1.48 A1c 7.4% Hgb 14.6, HCT 39.9, platelet 181 BUN 21, creatinine 0.7, GFR >60, sodium 135, potassium 4.2  04/20/2021:  Glucose 170, BUN 13, creatinine 0.80, GFR 96.4, sodium 131, potassium 3.7, AST 17, ALT 29, alk phos 95 Hemoglobin 14.8, hematocrit 43.8, MCV 89.6, platelet 274   Allergies   Allergies  Allergen Reactions   Bee Venom Anaphylaxis   Ezetimibe     Other reaction(s): Unknown   Insulin Degludec-Liraglutide     Other reaction(s): local skin reaction   Other     Other reaction(s): myalgia   Bydureon [Exenatide] Rash   Penicillins Hives and Rash    Medications   Current Outpatient Medications:    diphenhydramine-acetaminophen (TYLENOL PM EXTRA STRENGTH) 25-500 MG TABS tablet, 1 tablet at bedtime as needed, Disp: , Rfl:    Dulaglutide 1.5 MG/0.5ML SOPN, Inject 1.5 mg into the skin once a week., Disp: , Rfl:    ELDERBERRY PO, Take 1 capsule by mouth daily., Disp: , Rfl:    EPINEPHrine 0.3 mg/0.3 mL IJ SOAJ injection, Inject 0.3 mg into the muscle as needed for anaphylaxis., Disp: , Rfl:    metoprolol succinate (TOPROL-XL) 25 MG 24 hr tablet, Take 1 tablet (25 mg total) by mouth daily. (Patient taking differently: Take 50 mg by mouth daily.), Disp: 30 tablet, Rfl: 3   Misc Natural Products (TURMERIC CURCUMIN) CAPS, Take 1 capsule by mouth daily., Disp: , Rfl:    Multiple Vitamin (MULTIVITAMIN) tablet, Take 1 tablet by mouth daily., Disp: , Rfl:    omeprazole (PRILOSEC) 20 MG capsule, Take 20 mg by mouth every other day., Disp: , Rfl:    rosuvastatin (CRESTOR) 20 MG tablet, Take 1 tablet (20 mg total) by mouth at bedtime. (Patient taking differently: Take 10 mg by mouth at bedtime.), Disp: 30 tablet, Rfl: 3   sertraline (ZOLOFT) 50 MG tablet, Take 25 mg  by mouth daily., Disp: , Rfl:    tadalafil (CIALIS) 20 MG tablet, Take 20 mg by mouth  daily as needed for erectile dysfunction., Disp: , Rfl:    tamsulosin (FLOMAX) 0.4 MG CAPS capsule, Take 0.4 mg by mouth daily., Disp: , Rfl:    telmisartan-hydrochlorothiazide (MICARDIS HCT) 80-25 MG tablet, Take 1 tablet by mouth daily., Disp: , Rfl:    traMADol (ULTRAM) 50 MG tablet, Take 50 mg by mouth every 6 (six) hours as needed for moderate pain., Disp: , Rfl:    zinc gluconate 50 MG tablet, Take 50 mg by mouth daily., Disp: , Rfl:    aspirin EC 81 MG tablet, Take 81 mg by mouth daily., Disp: , Rfl:    Radiology:   Chest x-ray 04/22/2015: The lungs are adequately inflated and clear. The heart is top-normal in size but stable. The pulmonary vascularity is within the limits of normal. There is no pleural effusion. The mediastinum is normal in width. The bony thorax exhibits no acute abnormality.   IMPRESSION: There is no active cardiopulmonary disease.  Cardiac Studies:   Cardiac catheterization for an abnormal nuclear stress with anterior ischemia 04/25/2015: Right dominant circulation, normal coronary arteries.  Normal LV systolic function, EF 55 to 65%.    Lexiscan Tetrofosmin stress test 11/24/2020: Lexiscan nuclear stress test performed using 1-day protocol. SPECT images show large sized severe intensity, predominantly fixed perfusion defect in apical to basal inferior/inferolateral, and basal inferoseptal myocardium, with corresponding akinesis suggestive of infarct. Stress LVEF 34%. High risk study.   Compared to 04/17/2015, anterior ischemia was noted previously, patient does have a history of normal coronary angiography in 2016.  Echocardiogram 01/03/2021: Study Quality: Technically difficult study. Normal LV systolic function with visual EF 50-55%. Abnormal septal wall motion due to left bundle branch block. Left ventricle cavity is normal in size. Normal global wall motion. Normal  diastolic filling pattern, normal LAP.  No significant valvular heart disease. No prior study for comparison.  EKG:   EKG 12/27/2022: Normal sinus rhythm at the rate of 82 bpm, left bundle branch block.  No further analysis.  Compared to 06/14/2022, no change.  Assessment     ICD-10-CM   1. Essential hypertension  I10     2. LBBB (left bundle branch block)  I44.7     3. Chronic diastolic heart failure (HCC)  I50.32 EKG 12-Lead    4. Hypercholesteremia  E78.00     5. Class 3 severe obesity due to excess calories with serious comorbidity and body mass index (BMI) of 40.0 to 44.9 in adult [facility])  E66.01    Z68.41       Medications Discontinued During This Encounter  Medication Reason   Cholecalciferol (DIALYVITE VITAMIN D 5000) 125 MCG (5000 UT) capsule      No orders of the defined types were placed in this encounter.  Orders Placed This Encounter  Procedures   EKG 12-Lead    Recommendations:   [name] [name] is a 67 y.o. Caucasian male with hypertension, hyperlipidemia, diabetes mellitus, obstructive sleep apnea on CPAP, chronic left bundle branch block and chronic diastolic heart failure and morbid obesity with OSA on CPAP. This is an annual visit.   1. Essential hypertension Blood pressure is elevated today, I discussed with him regarding making changes to his medications, he will discuss with Dr. Brigitte Pulse regarding making changes.  Suspect he may need either addition of spironolactone or amlodipine to get his blood pressure to goal.  2. LBBB (left bundle branch block) LBBB is chronic.  His LVEF is normal.  He has had a high risk nuclear stress test  and I suspect the abnormality in the inferior wall was related to his obesity and weight and do not think scar tissue as his echocardiogram revealed no wall motion abnormality.  LBBB is chronic and longstanding.  No clinical evidence of heart failure.  3. Chronic diastolic heart failure ([place]) He has chronic diastolic heart failure,  no recent hospitalization, no leg edema, no JVD.  He is already on an ARB and a low-dose of beta-blocker, continue the same.  4. Hypercholesteremia Hypercholesterolemia discussed with the patient, would recommend I addition of the ezetimibe as goal LDL is <70 in view of diabetes mellitus.  Again patient states that he will discuss with Dr. Marton Redwood on his next office visit for management of diabetes as well.  5. Class 3 severe obesity due to excess calories with serious comorbidity and body mass index (BMI) of 40.0 to 44.9 in adult [facility]) Patient is presently on Trulicity at 1.5 mg dose, we could certainly increase the dose to the maximum tolerated dose.  Advised him to eat slow, to take a break in between the meal for about 10 minutes to improve satiety and hence may lead to weight loss and also improvement in overall wellbeing.  Otherwise from cardiac standpoint I have no further recommendations, I will see him back on a as needed basis.   Adrian Prows, MD, [facility] 12/27/2022, 12:35 PM Office: [phone] Fax: [phone] Pager: [phone]

## 2023-01-28 ENCOUNTER — Other Ambulatory Visit: Payer: Self-pay | Admitting: Internal Medicine

## 2023-01-28 DIAGNOSIS — R911 Solitary pulmonary nodule: Secondary | ICD-10-CM

## 2023-02-03 ENCOUNTER — Encounter: Payer: Self-pay | Admitting: Radiology

## 2023-02-17 ENCOUNTER — Ambulatory Visit
Admission: RE | Admit: 2023-02-17 | Discharge: 2023-02-17 | Disposition: A | Discharge status: Home / Self Care | Payer: Medicare Other | Source: Ambulatory Visit | Attending: Internal Medicine | Admitting: Internal Medicine

## 2023-02-17 DIAGNOSIS — R911 Solitary pulmonary nodule: Secondary | ICD-10-CM

## 2023-03-09 ENCOUNTER — Other Ambulatory Visit: Payer: Self-pay | Admitting: Cardiology

## 2023-04-07 ENCOUNTER — Other Ambulatory Visit (INDEPENDENT_AMBULATORY_CARE_PROVIDER_SITE_OTHER): Payer: Medicare Other

## 2023-04-07 ENCOUNTER — Ambulatory Visit: Payer: Medicare Other | Admitting: Orthopaedic Surgery

## 2023-04-07 ENCOUNTER — Encounter: Payer: Self-pay | Admitting: Orthopaedic Surgery

## 2023-04-07 DIAGNOSIS — M25561 Pain in right knee: Secondary | ICD-10-CM | POA: Diagnosis not present

## 2023-04-07 DIAGNOSIS — M25562 Pain in left knee: Secondary | ICD-10-CM

## 2023-04-07 DIAGNOSIS — G8929 Other chronic pain: Secondary | ICD-10-CM | POA: Diagnosis not present

## 2023-04-07 MED ORDER — METHYLPREDNISOLONE ACETATE 40 MG/ML IJ SUSP
40.0000 mg | INTRAMUSCULAR | Status: AC | PRN
Start: 2023-04-07 — End: 2023-04-07
  Administered 2023-04-07: 40 mg via INTRA_ARTICULAR

## 2023-04-07 MED ORDER — LIDOCAINE HCL 1 % IJ SOLN
3.0000 mL | INTRAMUSCULAR | Status: AC | PRN
Start: 2023-04-07 — End: 2023-04-07
  Administered 2023-04-07: 3 mL

## 2023-04-07 NOTE — Progress Notes (Signed)
The patient is only seen in the past.  We have actually performed arthroscopic surgery on both his knees but well over a decade ago for the left knee and almost 10 years ago for the right knee.  He is someone who is active but he weighs 305 pounds.  He is a diabetic.  He sees his primary care physician next week but did have his labs drawn yesterday for his annual physical.  He says his knees do hurt in the are stiff at times.  Sometimes there is a constant aching pain and is mainly the medial aspect of both knees.  He tries to stay active.  He asks if he would be a candidate for hyaluronic acid or not for his knees.  Both knees were examined today and neither has an effusion.  Both knees have slight varus malalignment that is correctable.  Both knees have some slight patellofemoral crepitation with the worst on the left than the right.  X-rays of both knees show mild to moderate tricompartment arthritis with mainly involving the medial joint line narrowing and patellofemoral narrowing of both knees.  I do feel is appropriate to place a steroid injection in both knees today to temporize his pain while we await ordering hyaluronic acid for both knees to treat the long-term pain from osteoarthritis.  He agrees with this treatment plan.  All question concerns were answered and addressed.  This patient is diagnosed with osteoarthritis of the knee(s).    Radiographs show evidence of joint space narrowing, osteophytes, subchondral sclerosis and/or subchondral cysts.  This patient has knee pain which interferes with functional and activities of daily living.    This patient has experienced inadequate response, adverse effects and/or intolerance with conservative treatments such as acetaminophen, NSAIDS, topical creams, physical therapy or regular exercise, knee bracing and/or weight loss.   This patient has experienced inadequate response or has a contraindication to intra articular steroid injections for at  least 3 months.   This patient is not scheduled to have a total knee replacement within 6 months of starting treatment with viscosupplementation.       Procedure Note  Patient: [name] [name]             Date of Birth: 1956-06-12           MRN: [id number]             Visit Date: 04/07/2023  Procedures: Visit Diagnoses:  1. Pain in both knees, unspecified chronicity   2. Chronic pain of right knee   3. Chronic pain of left knee     Large Joint Inj: R knee on 04/07/2023 5:07 PM Indications: diagnostic evaluation and pain Details: 22 G 1.5 in needle, superolateral approach  Arthrogram: No  Medications: 3 mL lidocaine 1 %; 40 mg methylPREDNISolone acetate 40 MG/ML Outcome: tolerated well, no immediate complications Procedure, treatment alternatives, risks and benefits explained, specific risks discussed. Consent was given by the patient. Immediately prior to procedure a time out was called to verify the correct patient, procedure, equipment, support staff and site/side marked as required. Patient was prepped and draped in the usual sterile fashion.    Large Joint Inj: L knee on 04/07/2023 5:07 PM Indications: diagnostic evaluation and pain Details: 22 G 1.5 in needle, superolateral approach  Arthrogram: No  Medications: 3 mL lidocaine 1 %; 40 mg methylPREDNISolone acetate 40 MG/ML Outcome: tolerated well, no immediate complications Procedure, treatment alternatives, risks and benefits explained, specific risks discussed. Consent was given by the  patient. Immediately prior to procedure a time out was called to verify the correct patient, procedure, equipment, support staff and site/side marked as required. Patient was prepped and draped in the usual sterile fashion.

## 2023-05-04 ENCOUNTER — Ambulatory Visit: Payer: Medicare Other | Admitting: Orthopaedic Surgery

## 2023-08-31 NOTE — Progress Notes (Signed)
PATIENT: [name] [name] DOB: 16-Jul-1956  REASON FOR VISIT: follow up HISTORY FROM: patient  Chief Complaint  Patient presents with   Follow-up    Pt in room 2. Here for cpap follow up. Pt reports doing well on cpap machine. No concerns.      HISTORY OF PRESENT ILLNESS:  09/05/23 ALL:  [name] [name] returns for follow up for OSA on CPAP. He continues to do well on therapy. He is using CPAP nightly for about 9-10 hours, on average. Leak has improved. Continues to use FFM. He denies concerns with machine or supplies. He is followed by PCP regularly. Also followed by ENT s/p oral cancer.     08/31/2022 ALL:  [name] [name] is a 67 y.o. male here today for follow up for OSA on CPAP.  He is doing very well. He is using CPAP nightly for about 9-10 hours. He can not sleep without therapy. He admits that he may extend mask life longer than 30 days. He also has facial hair that most likely contributes to leak.     HISTORY: (copied from Dr Teofilo Pod previous note)  [name] [name] is a 67 year old left-handed gentleman with an underlying medical history of diabetes, hypertension, hyperlipidemia, ED, lichen planus, SCC of the tongue with s/p partial glossectomy in 02/2021, and obesity, who presents for follow-up consultation of his obstructive sleep apnea after interim home sleep testing and starting AutoPap therapy.  The patient is unaccompanied today. He missed an appointment on 06/25/21. I last saw him on 12/30/2020 for re-evaluation of his sleep apnea at the request of his cardiologist.  He was advised to proceed with a sleep study.  He had a home sleep test on 02/02/2021 which indicated overall mild sleep apnea with an AHI of 9/h, O2 nadir 87%.  We did not have records from his old CPAP machine.  He was advised to start AutoPap therapy.  His set up date was 03/19/2021.   Today, 07/27/2021: I reviewed his AutoPap compliance data from 06/23/2021 through 07/22/2021 which is a total of 30 days, during which time he  used his machine 21 days with percent used days greater than 4 hours at 70%, indicating adequate compliance with an average usage of 9 hours and 24 minutes, residual AHI at goal at 1.1/h, average pressure for the 95th percentile at 9.2 cm with a range of 6 to 12 cm with EPR, 95th percentile of pressure elevated at 37.9 L/min.     I reviewed his AutoPap compliance data from 05/25/2021 through 06/23/2021, which is a total of 30 days, during which time he used his machine 29 days with percent use days greater than 4 hours at 97%, indicating excellent compliance with an average usage of 9 hours and 27 minutes, residual AHI at goal at 1.3/h, average pressure for the 95th percentile at 9 cm with a range of 6 to 12 cm with EPR, leak on the higher side with a 95th percentile at 38.2 L/min.  He reports that he has adjusted well to his AutoPap.  He had a gap in his compliance in late July and first week of August.  He reports that he used his old machine on vacation and kept his new machine at home.  He has generally been fully compliant and this is visible on compliance report as well.  I reviewed his 90-day compliance as well and he has been excellent with his compliance.  He is adjusting to his current surgery.  He has no significant pain  but has some numbness in that tongue area and the right posterior area of his mouth and sometimes food is lodged in there and he does not notice it until it moves.  Of note, he was diagnosed with squamous cell carcinoma of the tongue and is now status post partial glossectomy since April 2022.  He uses a full facemask.  He reports that given his lichen planus he was not able to use a nasal mask as the area inside his mouth where the nasal mask hit the upper lip became irritated in the past.  He has noticed a leak from the mass.  He reports that he may not be tightening it well enough.   REVIEW OF SYSTEMS: Out of a complete 14 system review of symptoms, the patient complains only of  the following symptoms, none and all other reviewed systems are negative.  ESS: 5/24, previously 4/24  ALLERGIES: Allergies  Allergen Reactions   Bee Venom Anaphylaxis   Ezetimibe     Other reaction(s): Unknown   Insulin Degludec-Liraglutide     Other reaction(s): local skin reaction   Other     Other reaction(s): myalgia   Bydureon [Exenatide] Rash   Penicillins Hives and Rash    HOME MEDICATIONS: Outpatient Medications Prior to Visit  Medication Sig Dispense Refill   aspirin EC 81 MG tablet Take 81 mg by mouth daily.     diphenhydramine-acetaminophen (TYLENOL PM EXTRA STRENGTH) 25-500 MG TABS tablet 1 tablet at bedtime as needed     Dulaglutide 1.5 MG/0.5ML SOPN Inject 1.5 mg into the skin once a week.     ELDERBERRY PO Take 1 capsule by mouth daily.     EPINEPHrine 0.3 mg/0.3 mL IJ SOAJ injection Inject 0.3 mg into the muscle as needed for anaphylaxis.     metoprolol tartrate (LOPRESSOR) 50 MG tablet TAKE 1 TABLET BY MOUTH TWICE DAILY (Patient taking differently: Take 50 mg by mouth once.) 180 tablet 3   Misc Natural Products (TURMERIC CURCUMIN) CAPS Take 1 capsule by mouth daily.     Multiple Vitamin (MULTIVITAMIN) tablet Take 1 tablet by mouth daily.     omeprazole (PRILOSEC) 20 MG capsule Take 20 mg by mouth every other day.     rosuvastatin (CRESTOR) 20 MG tablet Take 1 tablet (20 mg total) by mouth at bedtime. (Patient taking differently: Take 10 mg by mouth at bedtime.) 30 tablet 3   sertraline (ZOLOFT) 50 MG tablet Take 25 mg by mouth daily.     tadalafil (CIALIS) 20 MG tablet Take 20 mg by mouth daily as needed for erectile dysfunction.     tamsulosin (FLOMAX) 0.4 MG CAPS capsule Take 0.4 mg by mouth daily.     telmisartan-hydrochlorothiazide (MICARDIS HCT) 80-25 MG tablet Take 1 tablet by mouth daily.     traMADol (ULTRAM) 50 MG tablet Take 50 mg by mouth every 6 (six) hours as needed for moderate pain.     zinc gluconate 50 MG tablet Take 50 mg by mouth daily.      metoprolol succinate (TOPROL-XL) 25 MG 24 hr tablet Take 1 tablet (25 mg total) by mouth daily. (Patient not taking: Reported on 09/05/2023) 30 tablet 3   No facility-administered medications prior to visit.    PAST MEDICAL HISTORY: Past Medical History:  Diagnosis Date   Arthritis    KNEES AND NECK; TORN MENISCUS RT KNEE   Chest pain, atypical    Diabetes mellitus    ORAL MEDS   ED (erectile dysfunction)  GERD (gastroesophageal reflux disease)    H/O bee sting allergy    History of kidney stones    Hyperlipidemia    Hypertension    LBBB (left bundle branch block)    Lichen planus    OF MOUTH   Obesity    OSA (obstructive sleep apnea)    USES CPAP - DOES NOT KNOW SETTING   Sleep apnea    wears CPAP    PAST SURGICAL HISTORY: Past Surgical History:  Procedure Laterality Date   ANAL FISSURE REPAIR     CARDIAC CATHETERIZATION N/A 04/25/2015   Procedure: Left Heart Cath and Coronary Angiography;  Surgeon: Peter M Swaziland, MD;  Location: [facility] INVASIVE CV LAB;  Service: Cardiovascular;  Laterality: N/A;   CARDIOVASCULAR STRESS TEST  01/16/2010   EF 54%   COLONOSCOPY     KNEE ARTHROSCOPY Right 05/10/2014   Procedure: RIGHT KNEE ARTHROSCOPY WITH PARTIAL MEDIAL MENISCECTOMY;  Surgeon: Kathryne Hitch, MD;  Location: [facility];  Service: Orthopedics;  Laterality: Right;   oral cancer  02/2021   partial tongue   SINUS SURGERY WITH INSTATRAK     US ECHOCARDIOGRAPHY  09/19/2007   EF 55-60%   VASECTOMY      FAMILY HISTORY: Family History  Problem Relation Age of Onset   Hypertension Mother    Heart disease Mother    Rheum arthritis Mother    Kidney failure Father    Kidney disease Father    Heart disease Father    Colon cancer Neg Hx    Esophageal cancer Neg Hx    Stomach cancer Neg Hx    Rectal cancer Neg Hx     SOCIAL HISTORY: Social History   Socioeconomic History   Marital status: Married    Spouse name: Britta Mccreedy   Number of children: 2   Years of  education: Not on file   Highest education level: Not on file  Occupational History   Occupation: Airline pilot [employer]   Tobacco Use   Smoking status: Never   Smokeless tobacco: Never  Vaping Use   Vaping status: Never Used  Substance and Sexual Activity   Alcohol use: No    Alcohol/week: 0.0 standard drinks of alcohol   Drug use: No   Sexual activity: Yes  Other Topics Concern   Not on file  Social History Narrative   Not on file   Social Determinants of Health   Financial Resource Strain: Not on file  Food Insecurity: Low Risk  (03/21/2023)   [facility]   Hunger Vital Sign    Worried About Running Out of Food in the Last Year: Never true    Ran Out of Food in the Last Year: Never true  Transportation Needs: No Transportation Needs (03/21/2023)   [facility]   Transportation    In the past 12 months, has lack of reliable transportation kept you from medical appointments, meetings, work or from getting things needed for daily living? : No  Physical Activity: Not on file  Stress: Not on file  Social Connections: Not on file  Intimate Partner Violence: Not on file     PHYSICAL EXAM  Vitals:   09/05/23 1015  BP: 133/71  Pulse: 79  Weight: 295 lb 8 oz (134 kg)  Height: 6' (1.829 m)    Body mass index is 40.08 kg/m.  Generalized: Well developed, in no acute distress  Cardiology: normal rate and rhythm, no murmur noted Respiratory: clear to  auscultation bilaterally  Neurological examination  Mentation: Alert oriented to time, place, history taking. Follows all commands speech and language fluent Cranial nerve II-XII: Pupils were equal round reactive to light. Extraocular movements were full, visual field were full  Motor: The motor testing reveals 5 over 5 strength of all 4 extremities. Good symmetric motor tone is noted throughout.  Gait and station: Gait is normal.    DIAGNOSTIC DATA (LABS, IMAGING, TESTING) -  I reviewed patient records, labs, notes, testing and imaging myself where available.      No data to display           Lab Results  Component Value Date   WBC 5.6 03/11/2021   HGB 14.6 03/11/2021   HCT 42.8 03/11/2021   MCV 88.6 03/11/2021   PLT 227 03/11/2021      Component Value Date/Time   NA 137 03/11/2021 1029   K 3.8 03/11/2021 1029   CL 102 03/11/2021 1029   CO2 27 03/11/2021 1029   GLUCOSE 138 (H) 03/11/2021 1029   BUN 13 03/11/2021 1029   CREATININE 0.88 03/11/2021 1029   CREATININE 0.75 04/21/2015 0856   CALCIUM 9.5 03/11/2021 1029   GFRNONAA >60 03/11/2021 1029   GFRAA >90 05/07/2014 1500   No results found for: "CHOL", "HDL", "LDLCALC", "LDLDIRECT", "TRIG", "CHOLHDL" No results found for: "HGBA1C" No results found for: "VITAMINB12" No results found for: "TSH"   ASSESSMENT AND PLAN 67 y.o. year old male  has a past medical history of Arthritis, Chest pain, atypical, Diabetes mellitus, ED (erectile dysfunction), GERD (gastroesophageal reflux disease), H/O bee sting allergy, History of kidney stones, Hyperlipidemia, Hypertension, LBBB (left bundle branch block), Lichen planus, Obesity, OSA (obstructive sleep apnea), and Sleep apnea. here with     ICD-10-CM   1. OSA on CPAP  G47.33 For home use only DME continuous positive airway pressure (CPAP)      Cepeda Franqui is doing well on CPAP therapy. Compliance report reveals xcellent compliance. He was encouraged to continue using CPAP nightly and for greater than 4 hours each night. We will update supply orders as indicated. He will continue to montior for leak at home and change mask out regularly. Risks of untreated sleep apnea review and education materials provided. Healthy lifestyle habits encouraged. He will follow up in 1 year, sooner if needed. He verbalizes understanding and agreement with this plan.    Orders Placed This Encounter  Procedures   For home use only DME continuous positive airway pressure  (CPAP)    Supplies    Order Specific Question:   Length of Need    Answer:   Lifetime    Order Specific Question:   Patient has OSA or probable OSA    Answer:   Yes    Order Specific Question:   Is the patient currently using CPAP in the home    Answer:   Yes    Order Specific Question:   Settings    Answer:   Other see comments    Order Specific Question:   CPAP supplies needed    Answer:   Mask, headgear, cushions, filters, heated tubing and water chamber     No orders of the defined types were placed in this encounter.     Shawnie Dapper, FNP-C 09/05/2023, 10:22 AM [facility] [address] [place], [place] [address] [phone]

## 2023-08-31 NOTE — Patient Instructions (Signed)

## 2023-09-05 ENCOUNTER — Encounter: Payer: Self-pay | Admitting: Family Medicine

## 2023-09-05 ENCOUNTER — Ambulatory Visit: Payer: Medicare Other | Admitting: Family Medicine

## 2023-09-05 VITALS — BP 133/71 | HR 79 | Ht 72.0 in | Wt 295.5 lb

## 2023-09-05 DIAGNOSIS — G4733 Obstructive sleep apnea (adult) (pediatric): Secondary | ICD-10-CM | POA: Diagnosis not present

## 2023-10-12 ENCOUNTER — Other Ambulatory Visit (HOSPITAL_COMMUNITY): Payer: Self-pay | Admitting: Registered Nurse

## 2023-10-12 ENCOUNTER — Other Ambulatory Visit (HOSPITAL_COMMUNITY): Payer: Medicare Other

## 2023-10-12 DIAGNOSIS — R112 Nausea with vomiting, unspecified: Secondary | ICD-10-CM

## 2023-10-12 DIAGNOSIS — R109 Unspecified abdominal pain: Secondary | ICD-10-CM

## 2023-10-13 ENCOUNTER — Telehealth: Payer: Self-pay | Admitting: *Deleted

## 2023-10-13 ENCOUNTER — Ambulatory Visit: Payer: Self-pay | Admitting: General Surgery

## 2023-10-13 ENCOUNTER — Ambulatory Visit (HOSPITAL_COMMUNITY)
Admission: RE | Admit: 2023-10-13 | Discharge: 2023-10-13 | Disposition: A | Discharge status: Home / Self Care | Payer: Medicare Other | Source: Ambulatory Visit | Attending: Registered Nurse | Admitting: Registered Nurse

## 2023-10-13 DIAGNOSIS — K8020 Calculus of gallbladder without cholecystitis without obstruction: Secondary | ICD-10-CM

## 2023-10-13 DIAGNOSIS — R112 Nausea with vomiting, unspecified: Secondary | ICD-10-CM | POA: Insufficient documentation

## 2023-10-13 DIAGNOSIS — R109 Unspecified abdominal pain: Secondary | ICD-10-CM | POA: Insufficient documentation

## 2023-10-13 NOTE — Telephone Encounter (Signed)
   Pre-operative Risk Assessment    Patient Name: [name] [name]  DOB: Jun 22, 1956 MRN: [id number]   Last OV: Dr. Jacinto Halim 12/27/2022 Upcoming OV: None    Request for Surgical Clearance    Procedure:   Gallbladder Surgery  Date of Surgery:  Clearance TBD                                 Surgeon:  Dr. Chevis Pretty Surgeon's Group or Practice Name:  [facility] Phone number:  [phone] Fax number:  [phone]   Type of Clearance Requested:   - Medical  - Pharmacy:  Hold Aspirin Not Indicated.   Type of Anesthesia:  General    Additional requests/questions:    Signed, Emmit Pomfret   10/13/2023, 3:18 PM

## 2023-10-14 ENCOUNTER — Ambulatory Visit: Payer: Medicare Other | Attending: Physician Assistant | Admitting: Physician Assistant

## 2023-10-14 ENCOUNTER — Encounter: Payer: Self-pay | Admitting: Physician Assistant

## 2023-10-14 VITALS — BP 122/78 | HR 69 | Ht 72.0 in | Wt 286.6 lb

## 2023-10-14 DIAGNOSIS — I447 Left bundle-branch block, unspecified: Secondary | ICD-10-CM | POA: Diagnosis not present

## 2023-10-14 DIAGNOSIS — I10 Essential (primary) hypertension: Secondary | ICD-10-CM | POA: Diagnosis not present

## 2023-10-14 DIAGNOSIS — I5032 Chronic diastolic (congestive) heart failure: Secondary | ICD-10-CM

## 2023-10-14 DIAGNOSIS — Z01818 Encounter for other preprocedural examination: Secondary | ICD-10-CM

## 2023-10-14 DIAGNOSIS — E785 Hyperlipidemia, unspecified: Secondary | ICD-10-CM

## 2023-10-14 NOTE — Patient Instructions (Signed)
Medication Instructions:  No changes *If you need a refill on your cardiac medications before your next appointment, please call your pharmacy*   Lab Work: none If you have labs (blood work) drawn today and your tests are completely normal, you will receive your results only by: MyChart Message (if you have MyChart) OR A paper copy in the mail If you have any lab test that is abnormal or we need to change your treatment, we will call you to review the results.   Testing/Procedures: none   Follow-Up: As needed with Dr. Jacinto Halim A note will be sent to your surgeon re: clearance

## 2023-10-14 NOTE — Telephone Encounter (Signed)
Spoke with patient states that the surgeon is trying to do his surgery on 11/21 or 11/22 and needs a sooner appointment because it's urgent. I have scheduled patient for 11/15 at 1:55 with Jari Favre, PA-C. Patient verblized understanding thanked me for the call.

## 2023-10-14 NOTE — Progress Notes (Signed)
Cardiology Office Note:  .   Date:  10/14/2023  ID:  [name] [name], DOB 1956-08-09, MRN [id number] PCP: Cleatis Polka., MD  [facility] Providers Cardiologist:  None {  History of Present Illness: .   [name] [name] is a 67 y.o. male  with a history of left bundle branch block, hypertension, hyperlipidemia, diabetes mellitus, OSA on CPAP, chronic diastolic heart failure, and morbid obesity who presents for preoperative cardiac clearance for gallbladder surgery.   He reports no chest pain or shortness of breath and is currently asymptomatic from a cardiac standpoint. He has been stable since his last cardiology visit six to eight months ago, where he was told to follow up as needed. He has been active, recently returning from a [place] cruise where he was walking ten to twelve thousand steps a day. He also walks a mile to a mile and a half three times a week. He has no issues with household tasks or yard work and participates in recreational activities like walking and pickleball. He has stopped taking aspirin in preparation for the surgery.  He is okay to hold his aspirin for 5 to 7 days prior to surgery.  Please restart when medically safe to do so.  Reports no shortness of breath nor dyspnea on exertion. Reports no chest pain, pressure, or tightness. No edema, orthopnea, PND. Reports no palpitations.    Discussed the use of AI scribe software for clinical note transcription with the patient, who gave verbal consent to proceed.   ROS: Pertinent ROS in HPI  Studies Reviewed: Marland Kitchen   EKG Interpretation Date/Time:  Friday October 14 2023 14:00:10 EST Ventricular Rate:  69 PR Interval:  178 QRS Duration:  114 QT Interval:  426 QTC Calculation: 456 R Axis:   35  Text Interpretation: Normal sinus rhythm Cannot rule out Anterior infarct , age undetermined When compared with ECG of 14-Nov-2020 20:40, Left bundle branch block is no longer Present Minimal criteria for  Anterior infarct are now Present Confirmed by Jari Favre [phone]) on 10/14/2023 2:13:49 PM    None.      Physical Exam:   VS:  BP 122/78   Pulse 69   Ht 6' (1.829 m)   Wt 286 lb 9.6 oz (130 kg)   SpO2 97%   BMI 38.87 kg/m    Wt Readings from Last 3 Encounters:  10/14/23 286 lb 9.6 oz (130 kg)  09/05/23 295 lb 8 oz (134 kg)  12/27/22 (!) 305 lb 6.4 oz (138.5 kg)    GEN: Well nourished, well developed in no acute distress NECK: No JVD; No carotid bruits CARDIAC: RRR, no murmurs, rubs, gallops RESPIRATORY:  Clear to auscultation without rales, wheezing or rhonchi  ABDOMEN: Soft, non-tender, non-distended EXTREMITIES:  No edema; No deformity   ASSESSMENT AND PLAN: .   Preoperative Cardiac Clearance  Mr. Coverdale's perioperative risk of a major cardiac event is 0.9% according to the Revised Cardiac Risk Index (RCRI).  Therefore, he is at low risk for perioperative complications.   His functional capacity is excellent at 7.53 METs according to the Duke Activity Status Index (DASI). Recommendations: According to ACC/AHA guidelines, no further cardiovascular testing needed.  The patient may proceed to surgery at acceptable risk.   Antiplatelet and/or Anticoagulation Recommendations: Aspirin can be held for 5-7 days prior to his surgery.  Please resume Aspirin post operatively when it is felt to be safe from a bleeding standpoint.   Cardiac monitoring  No recent changes  in EKG, stable left bundle branch block noted for 30 years. -Complete preoperative cardiac clearance and fax to [facility] today. -Advise patient to hold aspirin 5-7 days prior to surgery.  Follow-up As needed basis with Dr. Jacinto Halim. No current cardiac symptoms or concerns. -Encourage patient to check in periodically or if any new symptoms arise.       Signed, Sharlene Dory, PA-C

## 2023-10-14 NOTE — Telephone Encounter (Signed)
   Name: [name] [name]  DOB: 09/02/1956  MRN: [id number]  Primary Cardiologist: None   Preoperative team, please contact this patient and set up a phone call appointment for further preoperative risk assessment. Please obtain consent and complete medication review. Thank you for your help.  I confirm that guidance regarding antiplatelet and oral anticoagulation therapy has been completed and, if necessary, noted below.  Per office protocol, if patient is without any new symptoms or concerns at the time of their virtual visit, he may hold Aspirin for 5-7 days prior to procedure. Please resume Aspirin as soon as possible postprocedure, at the discretion of the surgeon.   I also confirmed the patient resides in the state of [place]. As per [facility] Medical Board telemedicine laws, the patient must reside in the state in which the provider is licensed.   Joylene Grapes, NP 10/14/2023, 10:44 AM [facility]

## 2023-10-31 NOTE — Progress Notes (Signed)
COVID Vaccine Completed: yes  Date of COVID positive in last 67 days:no  PCP - Martha Clan, MD Cardiologist - Yates Decamp, MD  Cardiac clearance by Jari Favre, PA 10/14/23 in Epic  Chest x-ray - CT 02/21/23 Epic EKG - 10/14/23 Epic- LBBB Stress Test - 11/24/20 Epic ECHO - 01/03/21 Epic Cardiac Cath - 04/25/15 Epic Pacemaker/ICD device last checked: n/a Spinal Cord Stimulator: n/a  Bowel Prep - no  Sleep Study - yes  CPAP - every night  Fasting Blood Sugar -  Checks Blood Sugar  not currently taking  Last dose of GLP1 agonist-  Trulicity GLP1 instructions:  hasn't taken in a few weeks   Last dose of SGLT-2 inhibitors-  N/A SGLT-2 instructions:  Hold 3 days before surgery    Blood Thinner Instructions:  Time Aspirin Instructions: ASA 81, holding 2 weeks Last Dose:  Activity level: Can go up a flight of stairs and perform activities of daily living without stopping and without symptoms of chest pain or shortness of breath.   Anesthesia review: LBBB, OSA, DM, HTN  Patient denies shortness of breath, fever, cough and chest pain at PAT appointment  Patient verbalized understanding of instructions that were given to them at the PAT appointment. Patient was also instructed that they will need to review over the PAT instructions again at home before surgery.

## 2023-11-02 ENCOUNTER — Encounter (HOSPITAL_COMMUNITY)
Admission: RE | Admit: 2023-11-02 | Discharge: 2023-11-02 | Disposition: A | Discharge status: Home / Self Care | Payer: Medicare Other | Source: Ambulatory Visit | Attending: General Surgery | Admitting: General Surgery

## 2023-11-02 ENCOUNTER — Encounter (HOSPITAL_COMMUNITY): Payer: Self-pay

## 2023-11-02 ENCOUNTER — Other Ambulatory Visit: Payer: Self-pay

## 2023-11-02 VITALS — BP 153/80 | HR 70 | Temp 97.9°F | Resp 18 | Ht 72.0 in | Wt 291.0 lb

## 2023-11-02 DIAGNOSIS — G4733 Obstructive sleep apnea (adult) (pediatric): Secondary | ICD-10-CM | POA: Insufficient documentation

## 2023-11-02 DIAGNOSIS — E669 Obesity, unspecified: Secondary | ICD-10-CM | POA: Diagnosis not present

## 2023-11-02 DIAGNOSIS — K8020 Calculus of gallbladder without cholecystitis without obstruction: Secondary | ICD-10-CM | POA: Insufficient documentation

## 2023-11-02 DIAGNOSIS — Z6839 Body mass index (BMI) 39.0-39.9, adult: Secondary | ICD-10-CM | POA: Diagnosis not present

## 2023-11-02 DIAGNOSIS — I110 Hypertensive heart disease with heart failure: Secondary | ICD-10-CM | POA: Insufficient documentation

## 2023-11-02 DIAGNOSIS — I447 Left bundle-branch block, unspecified: Secondary | ICD-10-CM | POA: Insufficient documentation

## 2023-11-02 DIAGNOSIS — Z794 Long term (current) use of insulin: Secondary | ICD-10-CM | POA: Diagnosis not present

## 2023-11-02 DIAGNOSIS — K219 Gastro-esophageal reflux disease without esophagitis: Secondary | ICD-10-CM | POA: Insufficient documentation

## 2023-11-02 DIAGNOSIS — Z01812 Encounter for preprocedural laboratory examination: Secondary | ICD-10-CM | POA: Insufficient documentation

## 2023-11-02 DIAGNOSIS — E119 Type 2 diabetes mellitus without complications: Secondary | ICD-10-CM | POA: Insufficient documentation

## 2023-11-02 DIAGNOSIS — J439 Emphysema, unspecified: Secondary | ICD-10-CM | POA: Insufficient documentation

## 2023-11-02 DIAGNOSIS — I5032 Chronic diastolic (congestive) heart failure: Secondary | ICD-10-CM | POA: Diagnosis not present

## 2023-11-02 HISTORY — DX: Unspecified asthma, uncomplicated: J45.909

## 2023-11-02 HISTORY — DX: Malignant (primary) neoplasm, unspecified: C80.1

## 2023-11-02 LAB — COMPREHENSIVE METABOLIC PANEL
ALT: 27 U/L (ref 0–44)
AST: 21 U/L (ref 15–41)
Albumin: 4 g/dL (ref 3.5–5.0)
Alkaline Phosphatase: 78 U/L (ref 38–126)
Anion gap: 9 (ref 5–15)
BUN: 19 mg/dL (ref 8–23)
CO2: 26 mmol/L (ref 22–32)
Calcium: 9 mg/dL (ref 8.9–10.3)
Chloride: 102 mmol/L (ref 98–111)
Creatinine, Ser: 0.75 mg/dL (ref 0.61–1.24)
GFR, Estimated: >60 mL/min (ref >60)
Glucose, Bld: 144 mg/dL — ABNORMAL HIGH (ref 70–99)
Potassium: 4.3 mmol/L (ref 3.5–5.1)
Sodium: 137 mmol/L (ref 135–145)
Total Bilirubin: 0.6 mg/dL (ref <1.2)
Total Protein: 6.9 g/dL (ref 6.5–8.1)

## 2023-11-02 LAB — CBC
HCT: 40 % (ref 39.0–52.0)
Hemoglobin: 13.4 g/dL (ref 13.0–17.0)
MCH: 30.8 pg (ref 26.0–34.0)
MCHC: 33.5 g/dL (ref 30.0–36.0)
MCV: 92 fL (ref 80.0–100.0)
Platelets: 227 10*3/uL (ref 150–400)
RBC: 4.35 MIL/uL (ref 4.22–5.81)
RDW: 12.8 % (ref 11.5–15.5)
WBC: 5.2 10*3/uL (ref 4.0–10.5)
nRBC: 0 % (ref 0.0–0.2)

## 2023-11-02 LAB — HEMOGLOBIN A1C
Hgb A1c MFr Bld: 6.9 % — ABNORMAL HIGH (ref 4.8–5.6)
Mean Plasma Glucose: 151.33 mg/dL

## 2023-11-02 LAB — GLUCOSE, CAPILLARY: Glucose-Capillary: 157 mg/dL — ABNORMAL HIGH (ref 70–99)

## 2023-11-02 NOTE — Patient Instructions (Signed)
SURGICAL WAITING ROOM VISITATION  Patients having surgery or a procedure may have no more than 2 support people in the waiting area - these visitors may rotate.    Children under the age of 25 must have an adult with them who is not the patient.  Due to an increase in RSV and influenza rates and associated hospitalizations, children ages 49 and under may not visit patients in [facility] hospitals.  If the patient needs to stay at the hospital during part of their recovery, the visitor guidelines for inpatient rooms apply. Pre-op nurse will coordinate an appropriate time for 1 support person to accompany patient in pre-op.  This support person may not rotate.    Please refer to the [facility] website for the visitor guidelines for Inpatients (after your surgery is over and you are in a regular room).    Your procedure is scheduled on: 11/09/23   Report to [facility] Main Entrance    Report to admitting at 6:15 AM   Call this number if you have problems the morning of surgery [phone]   Do not eat food :After Midnight.   After Midnight you may have the following liquids until 5:30 AM DAY OF SURGERY  Water Non-Citrus Juices (without pulp, NO RED-Apple, White grape, White cranberry) Black Coffee (NO MILK/CREAM OR CREAMERS, sugar ok)  Clear Tea (NO MILK/CREAM OR CREAMERS, sugar ok) regular and decaf                             Plain Jell-O (NO RED)                                           Fruit ices (not with fruit pulp, NO RED)                                     Popsicles (NO RED)                                                               Sports drinks like Gatorade (NO RED)                      If you have questions, please contact your surgeon's office.   FOLLOW BOWEL PREP AND ANY ADDITIONAL PRE OP INSTRUCTIONS YOU RECEIVED FROM YOUR SURGEON'S OFFICE!!!     Oral Hygiene is also important to reduce your risk of infection.                                     Remember - BRUSH YOUR TEETH THE MORNING OF SURGERY WITH YOUR REGULAR TOOTHPASTE  DENTURES WILL BE REMOVED PRIOR TO SURGERY PLEASE DO NOT APPLY "Poly grip" OR ADHESIVES!!!   Stop all vitamins and herbal supplements 7 days before surgery.   Take these medicines the morning of surgery with A SIP OF WATER: Flonase, Omeprazole, Rosuvastatin, Sertraline, Tamsulosin, Tramadol  DO NOT TAKE ANY ORAL DIABETIC MEDICATIONS DAY  OF YOUR SURGERY  How to Manage Your Diabetes Before and After Surgery  Why is it important to control my blood sugar before and after surgery? Improving blood sugar levels before and after surgery helps healing and can limit problems. A way of improving blood sugar control is eating a healthy diet by:  Eating less sugar and carbohydrates  Increasing activity/exercise  Talking with your doctor about reaching your blood sugar goals High blood sugars (greater than 180 mg/dL) can raise your risk of infections and slow your recovery, so you will need to focus on controlling your diabetes during the weeks before surgery. Make sure that the doctor who takes care of your diabetes knows about your planned surgery including the date and location.  How do I manage my blood sugar before surgery? Check your blood sugar at least 4 times a day, starting 2 days before surgery, to make sure that the level is not too high or low. Check your blood sugar the morning of your surgery when you wake up and every 2 hours until you get to the Short Stay unit. If your blood sugar is less than 70 mg/dL, you will need to treat for low blood sugar: Do not take insulin. Treat a low blood sugar (less than 70 mg/dL) with  cup of clear juice (cranberry or apple), 4 glucose tablets, OR glucose gel. Recheck blood sugar in 15 minutes after treatment (to make sure it is greater than 70 mg/dL). If your blood sugar is not greater than 70 mg/dL on recheck, call [phone] for further instructions. Report your  blood sugar to the short stay nurse when you get to Short Stay.  If you are admitted to the hospital after surgery: Your blood sugar will be checked by the staff and you will probably be given insulin after surgery (instead of oral diabetes medicines) to make sure you have good blood sugar levels. The goal for blood sugar control after surgery is 80-180 mg/dL.   WHAT DO I DO ABOUT MY DIABETES MEDICATION?  Do not take oral diabetes medicines (pills) the morning of surgery.  DO NOT TAKE THE FOLLOWING 7 DAYS PRIOR TO SURGERY: Ozempic, Wegovy, Rybelsus (Semaglutide), Byetta (exenatide), Bydureon (exenatide ER), Victoza, Saxenda (liraglutide), or Trulicity (dulaglutide) Mounjaro (Tirzepatide) Adlyxin (Lixisenatide), Polyethylene Glycol Loxenatide.  Reviewed and Endorsed by [facility] Patient Education Committee, August 2015  Bring CPAP mask and tubing day of surgery.                              You may not have any metal on your body including jewelry, and body piercing             Do not wear lotions, powders, cologne, or deodorant              Men may shave face and neck.   Do not bring valuables to the hospital. [facility] IS NOT             RESPONSIBLE   FOR VALUABLES.   Contacts, glasses, dentures or bridgework may not be worn into surgery.  DO NOT BRING YOUR HOME MEDICATIONS TO THE HOSPITAL. PHARMACY WILL DISPENSE MEDICATIONS LISTED ON YOUR MEDICATION LIST TO YOU DURING YOUR ADMISSION IN THE HOSPITAL!    Patients discharged on the day of surgery will not be allowed to drive home.  Someone NEEDS to stay with you for the first 24 hours after anesthesia.  Please read over the following fact sheets you were given: IF YOU HAVE QUESTIONS ABOUT YOUR PRE-OP INSTRUCTIONS PLEASE CALL [phone] Contras    If you received a COVID test during your pre-op visit  it is requested that you wear a mask when out in public, stay away from anyone that may not be feeling well and  notify your surgeon if you develop symptoms. If you test positive for Covid or have been in contact with anyone that has tested positive in the last 10 days please notify you surgeon.    [facility] - Preparing for Surgery Before surgery, you can play an important role.  Because skin is not sterile, your skin needs to be as free of germs as possible.  You can reduce the number of germs on your skin by washing with CHG (chlorahexidine gluconate) soap before surgery.  CHG is an antiseptic cleaner which kills germs and bonds with the skin to continue killing germs even after washing. Please DO NOT use if you have an allergy to CHG or antibacterial soaps.  If your skin becomes reddened/irritated stop using the CHG and inform your nurse when you arrive at Short Stay. Do not shave (including legs and underarms) for at least 48 hours prior to the first CHG shower.  You may shave your face/neck.  Please follow these instructions carefully:  1.  Shower with CHG Soap the night before surgery and the  morning of surgery.  2.  If you choose to wash your hair, wash your hair first as usual with your normal  shampoo.  3.  After you shampoo, rinse your hair and body thoroughly to remove the shampoo.                             4.  Use CHG as you would any other liquid soap.  You can apply chg directly to the skin and wash.  Gently with a scrungie or clean washcloth.  5.  Apply the CHG Soap to your body ONLY FROM THE NECK DOWN.   Do   not use on face/ open                           Wound or open sores. Avoid contact with eyes, ears mouth and   genitals (private parts).                       Wash face,  Genitals (private parts) with your normal soap.             6.  Wash thoroughly, paying special attention to the area where your    surgery  will be performed.  7.  Thoroughly rinse your body with warm water from the neck down.  8.  DO NOT shower/wash with your normal soap after using and rinsing off the CHG Soap.                 9.  Pat yourself dry with a clean towel.            10.  Wear clean pajamas.            11.  Place clean sheets on your bed the night of your first shower and do not  sleep with pets. Day of Surgery : Do not apply any lotions/deodorants the morning of surgery.  Please wear clean clothes to the  hospital/surgery center.  FAILURE TO FOLLOW THESE INSTRUCTIONS MAY RESULT IN THE CANCELLATION OF YOUR SURGERY  PATIENT SIGNATURE_________________________________  NURSE SIGNATURE__________________________________  ________________________________________________________________________

## 2023-11-03 ENCOUNTER — Encounter (HOSPITAL_COMMUNITY): Payer: Self-pay

## 2023-11-03 NOTE — Progress Notes (Signed)
Case: [id number] Date/Time: 11/09/23 0815   Procedure: LAPAROSCOPIC CHOLECYSTECTOMY WITH INTRAOPERATIVE CHOLANGIOGRAM   Anesthesia type: General   Pre-op diagnosis: GALLSTONES   Location[facility] ROOM 04 / [facility]   Surgeons: Griselda Miner, MD       DISCUSSION: [name] [name] is a 67 yo male who presents to PAT prior to surgery above. PMH of HTN, HFpEF, LBBB, asthma, OSA (uses CPAP), pulmonary nodules, bronchiectasis, COPD (by CT scan), tongue cancer s/p resection, GERD, DM, arthritis, obesity (BMI 39).  Patient is followed by Cardiology for HFpEF and LBBB. He was previously released from the care of Cardiology and told to f/u prn. He had a pre-op visit with them on 10/14/23 and was cleared:   "[name] [name]'s perioperative risk of a major cardiac event is 0.9% according to the Revised Cardiac Risk Index (RCRI).  Therefore, he is at low risk for perioperative complications.   His functional capacity is excellent at 7.53 METs according to the Duke Activity Status Index (DASI). Recommendations: According to ACC/AHA guidelines, no further cardiovascular testing needed.  The patient may proceed to surgery at acceptable risk.   Antiplatelet and/or Anticoagulation Recommendations: Aspirin can be held for 5-7 days prior to his surgery.  Please resume Aspirin post operatively when it is felt to be safe from a bleeding standpoint."  VS: BP (!) 153/80   Pulse 70   Temp 36.6 C (Oral)   Resp 18   Ht 6' (1.829 m)   Wt 132 kg   SpO2 98%   BMI 39.47 kg/m   PROVIDERS: Cleatis Polka., MD   LABS: Labs reviewed: Acceptable for surgery. (all labs ordered are listed, but only abnormal results are displayed)  Labs Reviewed  COMPREHENSIVE METABOLIC PANEL - Abnormal; Notable for the following components:      Result Value   Glucose, Bld 144 (*)    All other components within normal limits  HEMOGLOBIN A1C - Abnormal; Notable for the following components:   Hgb A1c MFr Bld 6.9 (*)    All other  components within normal limits  GLUCOSE, CAPILLARY - Abnormal; Notable for the following components:   Glucose-Capillary 157 (*)    All other components within normal limits  CBC     IMAGES:  CT chest 02/17/23:  IMPRESSION: 1. Stable bilateral pulmonary nodules measuring up to 6 mm in the right lower lobe. No new nodule is seen. Non-contrast chest CT at 6-12 months is recommended. If the nodule is stable at time of repeat CT, then future CT at 18-24 months (from today's scan) is considered optional for low-risk patients, but is recommended for high-risk patients. This recommendation follows the consensus statement: Guidelines for Management of Incidental Pulmonary Nodules Detected on CT Images: From the Fleischner Society 2017; Radiology 2017; 284:228-243. 2. Bronchiectasis with mucous plugging in the right lower lobe, unchanged. 3. Mild emphysema. 4. Coronary artery calcifications. 5. Aortic atherosclerosis.  EKG11/15/24:  Normal sinus rhythm Cannot rule out Anterior infarct , age undetermined When compared with ECG of 14-Nov-2020 20:40, Left bundle branch block is no longer Present Minimal criteria for Anterior infarct are now Present  CV:  Echocardiogram 01/03/2021:  Study Quality: Technically difficult study. Normal LV systolic function with visual EF 50-55%. Abnormal septal wall motion due to left bundle branch block. Left ventricle cavity is normal in size. Normal global wall motion. Normal diastolic filling pattern, normal LAP. No significant valvular heart disease. No prior study for comparison.  Past Medical History:  Diagnosis Date  Arthritis    KNEES AND NECK; TORN MENISCUS RT KNEE   Asthma    Cancer (HCC)    tongue   Chest pain, atypical    Diabetes mellitus    ORAL MEDS   ED (erectile dysfunction)    GERD (gastroesophageal reflux disease)    H/O bee sting allergy    History of kidney stones    Hyperlipidemia    Hypertension    LBBB (left  bundle branch block)    Lichen planus    OF MOUTH   Obesity    OSA (obstructive sleep apnea)    USES CPAP - DOES NOT KNOW SETTING   Sleep apnea    wears CPAP    Past Surgical History:  Procedure Laterality Date   ANAL FISSURE REPAIR     CARDIAC CATHETERIZATION N/A 04/25/2015   Procedure: Left Heart Cath and Coronary Angiography;  Surgeon: Peter M Swaziland, MD;  Location: [facility] INVASIVE CV LAB;  Service: Cardiovascular;  Laterality: N/A;   CARDIOVASCULAR STRESS TEST  01/16/2010   EF 54%   COLONOSCOPY     KNEE ARTHROSCOPY Right 05/10/2014   Procedure: RIGHT KNEE ARTHROSCOPY WITH PARTIAL MEDIAL MENISCECTOMY;  Surgeon: Kathryne Hitch, MD;  Location: [facility];  Service: Orthopedics;  Laterality: Right;   KNEE ARTHROSCOPY Left    oral cancer  02/2021   partial tongue   SINUS SURGERY WITH INSTATRAK     US ECHOCARDIOGRAPHY  09/19/2007   EF 55-60%   VASECTOMY      MEDICATIONS:  ascorbic acid (VITAMIN C) 500 MG tablet   aspirin EC 81 MG tablet   diphenhydramine-acetaminophen (TYLENOL PM EXTRA STRENGTH) 25-500 MG TABS tablet   Dulaglutide (TRULICITY) 1.5 MG/0.5ML SOAJ   EPINEPHrine 0.3 mg/0.3 mL IJ SOAJ injection   fluticasone (FLONASE) 50 MCG/ACT nasal spray   Lactobacillus (ACIDOPHILUS) CAPS capsule   metoprolol tartrate (LOPRESSOR) 50 MG tablet   Misc Natural Products (TURMERIC CURCUMIN) CAPS   Multiple Vitamin (MULTIVITAMIN) capsule   Multiple Vitamins-Minerals (ZINC PO)   omeprazole (PRILOSEC) 20 MG capsule   rosuvastatin (CRESTOR) 10 MG tablet   sertraline (ZOLOFT) 25 MG tablet   tadalafil (CIALIS) 20 MG tablet   tamsulosin (FLOMAX) 0.4 MG CAPS capsule   telmisartan-hydrochlorothiazide (MICARDIS HCT) 80-25 MG tablet   traMADol (ULTRAM) 50 MG tablet   No current facility-administered medications for this encounter.   Marcille Blanco [facility] Short Stay/Anesthesiology [facility] Phone [phone] 11/03/2023 2:24 PM

## 2023-11-03 NOTE — Anesthesia Preprocedure Evaluation (Addendum)
Anesthesia Evaluation  Patient identified by MRN, date of birth, ID band Patient awake    Reviewed: Allergy & Precautions, NPO status , Patient's Chart, lab work & pertinent test results, reviewed documented beta blocker date and time   History of Anesthesia Complications Negative for: history of anesthetic complications  Airway Mallampati: III  TM Distance: >3 FB Neck ROM: Full    Dental  (+) Dental Advisory Given, Teeth Intact   Pulmonary asthma , sleep apnea and Continuous Positive Airway Pressure Ventilation    Pulmonary exam normal        Cardiovascular hypertension, Pt. on medications and Pt. on home beta blockers Normal cardiovascular exam+ dysrhythmias (LBBB)      Neuro/Psych negative neurological ROS  negative psych ROS   GI/Hepatic Neg liver ROS,GERD  Medicated and Controlled,,  Endo/Other  diabetes  Class 3 obesity  Renal/GU negative Renal ROS     Musculoskeletal  (+) Arthritis ,    Abdominal   Peds  Hematology negative hematology ROS (+)   Anesthesia Other Findings On Glp-1a, last dose over 7 days ago    Reproductive/Obstetrics                             Anesthesia Physical Anesthesia Plan  ASA: 3  Anesthesia Plan: General   Post-op Pain Management: Tylenol PO (pre-op)*   Induction: Intravenous  PONV Risk Score and Plan: 2 and Treatment may vary due to age or medical condition, Ondansetron and Dexamethasone  Airway Management Planned: Oral ETT  Additional Equipment: None  Intra-op Plan:   Post-operative Plan: Extubation in OR  Informed Consent: I have reviewed the patients History and Physical, chart, labs and discussed the procedure including the risks, benefits and alternatives for the proposed anesthesia with the patient or authorized representative who has indicated his/her understanding and acceptance.     Dental advisory given  Plan Discussed with:  CRNA and Anesthesiologist  Anesthesia Plan Comments: (See PAT note)        Anesthesia Quick Evaluation

## 2023-11-09 ENCOUNTER — Ambulatory Visit (HOSPITAL_BASED_OUTPATIENT_CLINIC_OR_DEPARTMENT_OTHER): Payer: Medicare Other | Admitting: Certified Registered Nurse Anesthetist

## 2023-11-09 ENCOUNTER — Ambulatory Visit (HOSPITAL_COMMUNITY)
Admission: RE | Admit: 2023-11-09 | Discharge: 2023-11-09 | Disposition: A | Discharge status: Home / Self Care | Payer: Medicare Other | Source: Ambulatory Visit | Attending: General Surgery | Admitting: General Surgery

## 2023-11-09 ENCOUNTER — Other Ambulatory Visit: Payer: Self-pay

## 2023-11-09 ENCOUNTER — Ambulatory Visit (HOSPITAL_COMMUNITY): Payer: Medicare Other | Admitting: Medical

## 2023-11-09 ENCOUNTER — Encounter (HOSPITAL_COMMUNITY)
Admission: RE | Disposition: A | Discharge status: Home / Self Care | Payer: Self-pay | Source: Ambulatory Visit | Attending: General Surgery

## 2023-11-09 ENCOUNTER — Encounter (HOSPITAL_COMMUNITY): Payer: Self-pay | Admitting: General Surgery

## 2023-11-09 DIAGNOSIS — Z6839 Body mass index (BMI) 39.0-39.9, adult: Secondary | ICD-10-CM | POA: Diagnosis not present

## 2023-11-09 DIAGNOSIS — K8012 Calculus of gallbladder with acute and chronic cholecystitis without obstruction: Secondary | ICD-10-CM | POA: Insufficient documentation

## 2023-11-09 DIAGNOSIS — G4730 Sleep apnea, unspecified: Secondary | ICD-10-CM | POA: Diagnosis not present

## 2023-11-09 DIAGNOSIS — I509 Heart failure, unspecified: Secondary | ICD-10-CM | POA: Diagnosis not present

## 2023-11-09 DIAGNOSIS — Z7985 Long-term (current) use of injectable non-insulin antidiabetic drugs: Secondary | ICD-10-CM | POA: Insufficient documentation

## 2023-11-09 DIAGNOSIS — I110 Hypertensive heart disease with heart failure: Secondary | ICD-10-CM | POA: Diagnosis not present

## 2023-11-09 DIAGNOSIS — E66813 Obesity, class 3: Secondary | ICD-10-CM | POA: Insufficient documentation

## 2023-11-09 DIAGNOSIS — E119 Type 2 diabetes mellitus without complications: Secondary | ICD-10-CM | POA: Diagnosis not present

## 2023-11-09 DIAGNOSIS — K8010 Calculus of gallbladder with chronic cholecystitis without obstruction: Secondary | ICD-10-CM | POA: Diagnosis not present

## 2023-11-09 HISTORY — PX: CHOLECYSTECTOMY: SHX55

## 2023-11-09 LAB — GLUCOSE, CAPILLARY
Glucose-Capillary: 164 mg/dL — ABNORMAL HIGH (ref 70–99)
Glucose-Capillary: 214 mg/dL — ABNORMAL HIGH (ref 70–99)
Glucose-Capillary: 229 mg/dL — ABNORMAL HIGH (ref 70–99)

## 2023-11-09 SURGERY — LAPAROSCOPIC CHOLECYSTECTOMY WITH INTRAOPERATIVE CHOLANGIOGRAM
Anesthesia: General

## 2023-11-09 MED ORDER — SUGAMMADEX SODIUM 200 MG/2ML IV SOLN
INTRAVENOUS | Status: DC | PRN
  Administered 2023-11-09: 100 mg via INTRAVENOUS
  Administered 2023-11-09: 300 mg via INTRAVENOUS

## 2023-11-09 MED ORDER — LIDOCAINE 2% (20 MG/ML) 5 ML SYRINGE
INTRAMUSCULAR | Status: DC | PRN
  Administered 2023-11-09: 100 mg via INTRAVENOUS

## 2023-11-09 MED ORDER — ONDANSETRON HCL 4 MG/2ML IJ SOLN
INTRAMUSCULAR | Status: AC
  Filled 2023-11-09: qty 2

## 2023-11-09 MED ORDER — ONDANSETRON HCL 4 MG/2ML IJ SOLN: 4.0000 mg | Freq: Once | INTRAMUSCULAR | Status: DC | PRN

## 2023-11-09 MED ORDER — FENTANYL CITRATE (PF) 100 MCG/2ML IJ SOLN
INTRAMUSCULAR | Status: DC | PRN
  Administered 2023-11-09: 100 ug via INTRAVENOUS

## 2023-11-09 MED ORDER — PROPOFOL 10 MG/ML IV BOLUS
INTRAVENOUS | Status: DC | PRN
  Administered 2023-11-09: 200 mg via INTRAVENOUS

## 2023-11-09 MED ORDER — DEXAMETHASONE SODIUM PHOSPHATE 10 MG/ML IJ SOLN
INTRAMUSCULAR | Status: AC
  Filled 2023-11-09: qty 1

## 2023-11-09 MED ORDER — ONDANSETRON HCL 4 MG/2ML IJ SOLN
INTRAMUSCULAR | Status: DC | PRN
  Administered 2023-11-09: 4 mg via INTRAVENOUS

## 2023-11-09 MED ORDER — INSULIN ASPART 100 UNIT/ML IJ SOLN
0.0000 [IU] | INTRAMUSCULAR | Status: AC | PRN
  Administered 2023-11-09: 4 [IU] via SUBCUTANEOUS
  Administered 2023-11-09: 2 [IU] via SUBCUTANEOUS

## 2023-11-09 MED ORDER — CHLORHEXIDINE GLUCONATE 0.12 % MT SOLN
15.0000 mL | Freq: Once | OROMUCOSAL | Status: AC
  Administered 2023-11-09: 15 mL via OROMUCOSAL

## 2023-11-09 MED ORDER — INSULIN ASPART 100 UNIT/ML IJ SOLN
INTRAMUSCULAR | Status: AC
  Filled 2023-11-09: qty 1

## 2023-11-09 MED ORDER — CHLORHEXIDINE GLUCONATE CLOTH 2 % EX PADS: 6.0000 | MEDICATED_PAD | Freq: Once | CUTANEOUS | Status: DC

## 2023-11-09 MED ORDER — PROPOFOL 10 MG/ML IV BOLUS
INTRAVENOUS | Status: AC
  Filled 2023-11-09: qty 20

## 2023-11-09 MED ORDER — ACETAMINOPHEN 500 MG PO TABS: 1000.0000 mg | ORAL_TABLET | ORAL | Status: DC

## 2023-11-09 MED ORDER — GABAPENTIN 100 MG PO CAPS
100.0000 mg | ORAL_CAPSULE | ORAL | Status: AC
Start: 2023-11-09 — End: 2023-11-09
  Administered 2023-11-09: 100 mg via ORAL
  Filled 2023-11-09: qty 1

## 2023-11-09 MED ORDER — LACTATED RINGERS IR SOLN
Status: DC | PRN
  Administered 2023-11-09: 1000 mL

## 2023-11-09 MED ORDER — FENTANYL CITRATE PF 50 MCG/ML IJ SOSY: 25.0000 ug | PREFILLED_SYRINGE | INTRAMUSCULAR | Status: DC | PRN

## 2023-11-09 MED ORDER — ROCURONIUM BROMIDE 10 MG/ML (PF) SYRINGE
PREFILLED_SYRINGE | INTRAVENOUS | Status: AC
  Filled 2023-11-09: qty 10

## 2023-11-09 MED ORDER — OXYCODONE HCL 5 MG PO TABS: 5.0000 mg | ORAL_TABLET | Freq: Four times a day (QID) | ORAL | 0 refills | Status: AC | PRN

## 2023-11-09 MED ORDER — CIPROFLOXACIN IN D5W 400 MG/200ML IV SOLN
400.0000 mg | INTRAVENOUS | Status: AC
  Administered 2023-11-09: 400 mg via INTRAVENOUS
  Filled 2023-11-09: qty 200

## 2023-11-09 MED ORDER — BUPIVACAINE-EPINEPHRINE 0.25% -1:200000 IJ SOLN
INTRAMUSCULAR | Status: AC
Start: 2023-11-09 — End: ?
  Filled 2023-11-09: qty 1

## 2023-11-09 MED ORDER — OXYCODONE HCL 5 MG PO TABS
5.0000 mg | ORAL_TABLET | Freq: Once | ORAL | Status: AC | PRN
  Administered 2023-11-09: 5 mg via ORAL

## 2023-11-09 MED ORDER — 0.9 % SODIUM CHLORIDE (POUR BTL) OPTIME
TOPICAL | Status: DC | PRN
  Administered 2023-11-09: 1000 mL

## 2023-11-09 MED ORDER — FENTANYL CITRATE (PF) 100 MCG/2ML IJ SOLN
INTRAMUSCULAR | Status: AC
  Filled 2023-11-09: qty 2

## 2023-11-09 MED ORDER — LIDOCAINE HCL (PF) 2 % IJ SOLN
INTRAMUSCULAR | Status: AC
  Filled 2023-11-09: qty 5

## 2023-11-09 MED ORDER — BUPIVACAINE-EPINEPHRINE 0.25% -1:200000 IJ SOLN
INTRAMUSCULAR | Status: DC | PRN
  Administered 2023-11-09: 30 mL

## 2023-11-09 MED ORDER — ORAL CARE MOUTH RINSE: 15.0000 mL | Freq: Once | OROMUCOSAL | Status: AC

## 2023-11-09 MED ORDER — DEXAMETHASONE SODIUM PHOSPHATE 4 MG/ML IJ SOLN
INTRAMUSCULAR | Status: DC | PRN
  Administered 2023-11-09: 4 mg via INTRAVENOUS

## 2023-11-09 MED ORDER — MIDAZOLAM HCL 2 MG/2ML IJ SOLN
INTRAMUSCULAR | Status: AC
  Filled 2023-11-09: qty 2

## 2023-11-09 MED ORDER — OXYCODONE HCL 5 MG/5ML PO SOLN: 5.0000 mg | Freq: Once | ORAL | Status: AC | PRN

## 2023-11-09 MED ORDER — ACETAMINOPHEN 500 MG PO TABS
1000.0000 mg | ORAL_TABLET | Freq: Once | ORAL | Status: AC
  Administered 2023-11-09: 1000 mg via ORAL
  Filled 2023-11-09: qty 2

## 2023-11-09 MED ORDER — ROCURONIUM BROMIDE 10 MG/ML (PF) SYRINGE
PREFILLED_SYRINGE | INTRAVENOUS | Status: DC | PRN
  Administered 2023-11-09: 90 mg via INTRAVENOUS

## 2023-11-09 MED ORDER — OXYCODONE HCL 5 MG PO TABS
ORAL_TABLET | ORAL | Status: AC
  Filled 2023-11-09: qty 1

## 2023-11-09 MED ORDER — LACTATED RINGERS IV SOLN: INTRAVENOUS | Status: DC

## 2023-11-09 SURGICAL SUPPLY — 31 items
APPLIER CLIP 5 13 M/L LIGAMAX5 (MISCELLANEOUS) ×1
BAG COUNTER SPONGE SURGICOUNT (BAG) IMPLANT
CABLE HIGH FREQUENCY MONO STRZ (ELECTRODE) ×2 IMPLANT
CATH REDDICK CHOLANGI 4FR 50CM (CATHETERS) ×2 IMPLANT
CHLORAPREP W/TINT 26 (MISCELLANEOUS) ×2 IMPLANT
CLIP APPLIE 5 13 M/L LIGAMAX5 (MISCELLANEOUS) ×2 IMPLANT
COVER MAYO STAND XLG (MISCELLANEOUS) ×2 IMPLANT
DERMABOND ADVANCED .7 DNX12 (GAUZE/BANDAGES/DRESSINGS) ×2 IMPLANT
DRAPE C-ARM 42X120 X-RAY (DRAPES) ×2 IMPLANT
ELECT REM PT RETURN 15FT ADLT (MISCELLANEOUS) ×2 IMPLANT
GLOVE BIO SURGEON STRL SZ7.5 (GLOVE) ×2 IMPLANT
GOWN STRL REUS W/ TWL LRG LVL3 (GOWN DISPOSABLE) IMPLANT
HEMOSTAT SNOW SURGICEL 2X4 (HEMOSTASIS) IMPLANT
IRRIG SUCT STRYKERFLOW 2 WTIP (MISCELLANEOUS) ×1
IRRIGATION SUCT STRKRFLW 2 WTP (MISCELLANEOUS) ×2 IMPLANT
IV CATH 14GX2 1/4 (CATHETERS) ×2 IMPLANT
KIT BASIN OR (CUSTOM PROCEDURE TRAY) ×2 IMPLANT
KIT TURNOVER KIT A (KITS) IMPLANT
PENCIL SMOKE EVACUATOR (MISCELLANEOUS) IMPLANT
SCISSORS LAP 5X35 DISP (ENDOMECHANICALS) ×2 IMPLANT
SET TUBE SMOKE EVAC HIGH FLOW (TUBING) ×2 IMPLANT
SLEEVE Z-THREAD 5X100MM (TROCAR) ×4 IMPLANT
SPIKE FLUID TRANSFER (MISCELLANEOUS) ×2 IMPLANT
SUT MNCRL AB 4-0 PS2 18 (SUTURE) ×2 IMPLANT
SYS BAG RETRIEVAL 10MM (BASKET) ×1
SYSTEM BAG RETRIEVAL 10MM (BASKET) ×2 IMPLANT
TOWEL OR 17X26 10 PK STRL BLUE (TOWEL DISPOSABLE) ×2 IMPLANT
TOWEL OR NON WOVEN STRL DISP B (DISPOSABLE) ×2 IMPLANT
TRAY LAPAROSCOPIC (CUSTOM PROCEDURE TRAY) ×2 IMPLANT
TROCAR BALLN 12MMX100 BLUNT (TROCAR) ×2 IMPLANT
TROCAR Z-THREAD OPTICAL 5X100M (TROCAR) ×2 IMPLANT

## 2023-11-09 NOTE — Anesthesia Postprocedure Evaluation (Signed)
Anesthesia Post Note  Patient: [name] [name]  Procedure(s) Performed: LAPAROSCOPIC CHOLECYSTECTOMY     Patient location during evaluation: PACU Anesthesia Type: General Level of consciousness: awake and alert Pain management: pain level controlled Vital Signs Assessment: post-procedure vital signs reviewed and stable Respiratory status: spontaneous breathing, nonlabored ventilation and respiratory function stable Cardiovascular status: stable and blood pressure returned to baseline Anesthetic complications: no   No notable events documented.  Last Vitals:  Vitals:   11/09/23 1000 11/09/23 1015  BP: (!) 99/51 (!) 112/59  Pulse: 76 73  Resp: 12 13  Temp:  36.4 C  SpO2: 91% 93%    Last Pain:  Vitals:   11/09/23 1015  TempSrc:   PainSc: 4                  Beryle Lathe

## 2023-11-09 NOTE — H&P (Signed)
REFERRING PHYSICIAN: Dante Gang* PROVIDER: Lindell Noe, MD MRN: [id number] DOB: [birth year] Subjective   Chief Complaint: Cholelithiasis  History of Present Illness: [name] [name] is a 67 y.o. male who is seen today as an office consultation for evaluation of Cholelithiasis  We are asked to see the patient in consultation by Dr. Eric Form to evaluate him for gallstones. The patient is a 67 year old white male who began having upper abdominal pain on Tuesday after returning from a cruise to the [place]. He described the pain as a tightness in his upper abdomen that radiated around to the back on both sides. The pain was associated with nausea and vomiting. He was evaluated with ultrasound which did show stones in the gallbladder but no gallbladder wall thickening although he was tender over the gallbladder. He does have some congestive heart failure in the past.  Review of Systems: A complete review of systems was obtained from the patient. I have reviewed this information and discussed as appropriate with the patient. See HPI as well for other ROS.  ROS   Medical History: Past Medical History:  Diagnosis Date  Arthritis  Asthma, unspecified asthma severity, unspecified whether complicated, unspecified whether persistent (HHS-HCC)  Diabetes mellitus without complication (CMS/HHS-HCC)  History of cancer  Hyperlipidemia  Sleep apnea   Patient Active Problem List  Diagnosis  Calculus of gallbladder and bile duct with acute cholecystitis without obstruction   History reviewed. No pertinent surgical history.   Allergies  Allergen Reactions  Penicillins Hives and Rash   Current Outpatient Medications on File Prior to Visit  Medication Sig Dispense Refill  aspirin 81 MG EC tablet Take 81 mg by mouth once daily  dulaglutide (TRULICITY) 1.5 mg/0.5 mL subcutaneous pen injector Inject 1.5 mg subcutaneously  metoprolol tartrate (LOPRESSOR) 50 MG tablet Take 1 tablet  by mouth 2 (two) times daily  rosuvastatin (CRESTOR) 10 MG tablet  sertraline (ZOLOFT) 25 MG tablet Take 25 mg by mouth every morning  tamsulosin (FLOMAX) 0.4 mg capsule Take 0.4 mg by mouth once daily  telmisartan-hydroCHLOROthiazide (MICARDIS HCT) 80-25 mg tablet Take 1 tablet by mouth once daily  traMADoL (ULTRAM) 50 mg tablet Take 50 mg by mouth   No current facility-administered medications on file prior to visit.   Family History  Problem Relation Age of Onset  Stroke Mother  Hyperlipidemia (Elevated cholesterol) Mother  Myocardial Infarction (Heart attack) Mother  Hyperlipidemia (Elevated cholesterol) Father  Hyperlipidemia (Elevated cholesterol) Brother    Social History   Tobacco Use  Smoking Status Never  Smokeless Tobacco Never    Social History   Socioeconomic History  Marital status: Married  Tobacco Use  Smoking status: Never  Smokeless tobacco: Never   Social Drivers of Architectural technologist Insecurity: Low Risk (03/21/2023)  Received from [facility]  Hunger Vital Sign  Worried About Running Out of Food in the Last Year: Never true  Ran Out of Food in the Last Year: Never true  Transportation Needs: No Transportation Needs (03/21/2023)  Received from [employer]  In the past 12 months, has lack of reliable transportation kept you from medical appointments, meetings, work or from getting things needed for daily living? : No  Housing Stability: Low Risk (03/21/2023)  [facility] from [facility] Stability Vital Sign  What is your living situation today?: I have a steady place to live   Objective:   Vitals:   BP: 126/72  Pulse: 86  Weight: (!) 132.5 kg (292  lb 3.2 oz)  Height: 182.9 cm (6')  PainSc: 0-No pain   Body mass index is 39.63 kg/m.  Physical Exam Constitutional:  General: He is not in acute distress. Appearance: Normal appearance.  HENT:  Head: Normocephalic and atraumatic.  Right Ear: External ear normal.   Left Ear: External ear normal.  Nose: Nose normal.  Mouth/Throat:  Mouth: Mucous membranes are moist.  Pharynx: Oropharynx is clear.  Eyes:  General: No scleral icterus. Extraocular Movements: Extraocular movements intact.  Conjunctiva/sclera: Conjunctivae normal.  Pupils: Pupils are equal, round, and reactive to light.  Cardiovascular:  Rate and Rhythm: Normal rate and regular rhythm.  Pulses: Normal pulses.  Heart sounds: Normal heart sounds.  Pulmonary:  Effort: Pulmonary effort is normal. No respiratory distress.  Breath sounds: Normal breath sounds.  Abdominal:  General: Abdomen is flat. Bowel sounds are normal. There is no distension.  Palpations: Abdomen is soft.  Comments: There is mild tenderness to palpation in the right upper quadrant. There is no palpable mass.  Musculoskeletal:  General: No swelling or deformity. Normal range of motion.  Cervical back: Normal range of motion and neck supple. No tenderness.  Skin: General: Skin is warm and dry.  Coloration: Skin is not jaundiced.  Neurological:  General: No focal deficit present.  Mental Status: He is alert and oriented to person, place, and time.  Psychiatric:  Mood and Affect: Mood normal.  Behavior: Behavior normal.     Labs, Imaging and Diagnostic Testing:  Assessment and Plan:   Diagnoses and all orders for this visit:  Calculus of gallbladder and bile duct with acute cholecystitis without obstruction   The patient appears to have symptomatic gallstones. Because of the risk of further painful episodes and possible pancreatitis I feel he would benefit from having his gallbladder removed. He would also like to have this done. I have discussed with him in detail the risks and benefits of the operation as well as some of the technical aspects including the risk of common bile duct injury and he understands and wishes to proceed. We will try to schedule this in the very near future form since he has had a  recent painful episode

## 2023-11-09 NOTE — Transfer of Care (Signed)
Immediate Anesthesia Transfer of Care Note  Patient: [name] [name]  Procedure(s) Performed: Procedure(s): LAPAROSCOPIC CHOLECYSTECTOMY (N/A)  Patient Location: PACU  Anesthesia Type:General  Level of Consciousness: Patient easily awoken, comfortable, cooperative, following commands, responds to stimulation.   Airway & Oxygen Therapy: Patient spontaneously breathing, ventilating well, oxygen via simple oxygen mask.  Post-op Assessment: Report given to PACU RN, vital signs reviewed and stable, moving all extremities.   Post vital signs: Reviewed and stable.  Complications: No apparent anesthesia complications Last Vitals:  Vitals Value Taken Time  BP 122/62 11/09/23 0935  Temp    Pulse 72 11/09/23 0937  Resp 13 11/09/23 0937  SpO2 100 % 11/09/23 0937  Vitals shown include unfiled device data.  Last Pain:  Vitals:   11/09/23 0651  TempSrc: Oral  PainSc:          Complications: No notable events documented.

## 2023-11-09 NOTE — Anesthesia Procedure Notes (Signed)
Procedure Name: Intubation Date/Time: 11/09/2023 8:34 AM  Performed by: Ludwig Lean, CRNAPre-anesthesia Checklist: Patient identified, Emergency Drugs available, Suction available and Patient being monitored Patient Re-evaluated:Patient Re-evaluated prior to induction Oxygen Delivery Method: Circle system utilized Preoxygenation: Pre-oxygenation with 100% oxygen Induction Type: IV induction Ventilation: Mask ventilation without difficulty Laryngoscope Size: Mac and 4 Grade View: Grade II Tube type: Oral Tube size: 7.5 mm Number of attempts: 1 Airway Equipment and Method: Stylet Placement Confirmation: ETT inserted through vocal cords under direct vision, positive ETCO2 and breath sounds checked- equal and bilateral Secured at: 22 cm Tube secured with: Tape Dental Injury: Teeth and Oropharynx as per pre-operative assessment

## 2023-11-09 NOTE — Op Note (Signed)
11/09/2023  9:26 AM  PATIENT:  [name] [name]  67 y.o. male  PRE-OPERATIVE DIAGNOSIS:  GALLSTONES  POST-OPERATIVE DIAGNOSIS:  GALLSTONES WITH CHOLECYSTITIS  PROCEDURE:  Procedure(s): LAPAROSCOPIC CHOLECYSTECTOMY (N/A)  SURGEON:  Surgeons and Role:    * Griselda Miner, MD - Primary  PHYSICIAN ASSISTANT:   ASSISTANTS: none   ANESTHESIA:   local and general  EBL:  Minimal   BLOOD ADMINISTERED:none  DRAINS: none   LOCAL MEDICATIONS USED:  MARCAINE     SPECIMEN:  Source of Specimen:  gallbladder  DISPOSITION OF SPECIMEN:  PATHOLOGY  COUNTS:  YES  TOURNIQUET:  * No tourniquets in log *  DICTATION: .Dragon Dictation    Procedure: After informed consent was obtained the patient was brought to the operating room and placed in the supine position on the operating room table. After adequate induction of general anesthesia the patient's abdomen was prepped with ChloraPrep allowed to dry and draped in usual sterile manner. An appropriate timeout was performed. The area below the umbilicus was infiltrated with quarter percent  Marcaine. A small incision was made with a 15 blade knife. The incision was carried down through the subcutaneous tissue bluntly with a hemostat and [employer] retractors. The linea alba was identified. The linea alba was incised with a 15 blade knife and each side was grasped with Coker clamps. The preperitoneal space was then probed with a hemostat until the peritoneum was opened and access was gained to the abdominal cavity. A 0 Vicryl pursestring stitch was placed in the fascia surrounding the opening. A Hassan cannula was then placed through the opening and anchored in place with the previously placed Vicryl purse string stitch. The abdomen was insufflated with carbon dioxide without difficulty. A laparoscope was inserted through the [facility] cannula in the right upper quadrant was inspected. Next the epigastric region was infiltrated with % Marcaine. A small  incision was made with a 15 blade knife. A 5 mm port was placed bluntly through this incision into the abdominal cavity under direct vision. Next 2 sites were chosen laterally on the right side of the abdomen for placement of 5 mm ports. Each of these areas was infiltrated with quarter percent Marcaine. Small stab incisions were made with a 15 blade knife. 5 mm ports were then placed bluntly through these incisions into the abdominal cavity under direct vision without difficulty. A blunt grasper was placed through the lateralmost 5 mm port and used to grasp the dome of the gallbladder and elevate it anteriorly and superiorly. Another blunt grasper was placed through the other 5 mm port and used to retract the body and neck of the gallbladder. A dissector was placed through the epigastric port and using the electrocautery the peritoneal reflection at the gallbladder neck was opened. Blunt dissection was then carried out in this area until the gallbladder neck-cystic duct junction was readily identified and a good critical window was created.  The gallbladder and cystic duct were significantly inflamed and friable.  I could see the anatomy very well.  I elected not to get a cholangiogram since his liver functions were normal.  3 clips were placed proximally on the cystic duct and 1 distally and the duct was divided between the 2 sets of clips. Posterior to this the cystic artery was identified and again dissected bluntly in a circumferential manner until a good window  was created. 2 clips were placed proximally and one distally on the artery and the artery was divided between the 2 sets  of clips. Next a laparoscopic hook cautery device was used to separate the gallbladder from the liver bed. Prior to completely detaching the gallbladder from the liver bed the liver bed was inspected and several small bleeding points were coagulated with the electrocautery until the area was completely hemostatic. The gallbladder was  then detached the rest of it from the liver bed without difficulty. A laparoscopic bag was inserted through the hassan port. The laparoscope was moved to the epigastric port. The gallbladder was placed within the bag and the bag was sealed.  The bag with the gallbladder was then removed with the [facility] cannula through the infraumbilical port without difficulty. The fascial defect was then closed with the previously placed Vicryl pursestring stitch as well as with another figure-of-eight 0 Vicryl stitch. The liver bed was inspected again and found to be hemostatic. The abdomen was irrigated with copious amounts of saline until the effluent was clear. The ports were then removed under direct vision without difficulty and were found to be hemostatic. The gas was allowed to escape. No other abnormalities were noted on general inspection of the abdomen. The skin incisions were all closed with interrupted 4-0 Monocryl subcuticular stitches. Dermabond dressings were applied. The patient tolerated the procedure well. At the end of the case all needle sponge and instrument counts were correct. The patient was then awakened and taken to recovery in stable condition  PLAN OF CARE: Discharge to home after PACU  PATIENT DISPOSITION:  PACU - hemodynamically stable.   Delay start of Pharmacological VTE agent (>24hrs) due to surgical blood loss or risk of bleeding: not applicable

## 2023-11-09 NOTE — Interval H&P Note (Signed)
History and Physical Interval Note:  11/09/2023 8:05 AM  [name] [name]  has presented today for surgery, with the diagnosis of GALLSTONES.  The various methods of treatment have been discussed with the patient and family. After consideration of risks, benefits and other options for treatment, the patient has consented to  Procedure(s): LAPAROSCOPIC CHOLECYSTECTOMY WITH INTRAOPERATIVE CHOLANGIOGRAM (N/A) as a surgical intervention.  The patient's history has been reviewed, patient examined, no change in status, stable for surgery.  I have reviewed the patient's chart and labs.  Questions were answered to the patient's satisfaction.     Chevis Pretty III

## 2023-11-10 ENCOUNTER — Encounter (HOSPITAL_COMMUNITY): Payer: Self-pay | Admitting: General Surgery

## 2023-11-10 LAB — SURGICAL PATHOLOGY

## 2023-12-21 ENCOUNTER — Other Ambulatory Visit: Payer: Self-pay | Admitting: Internal Medicine

## 2023-12-21 DIAGNOSIS — R911 Solitary pulmonary nodule: Secondary | ICD-10-CM

## 2024-01-05 ENCOUNTER — Ambulatory Visit
Admission: RE | Admit: 2024-01-05 | Discharge: 2024-01-05 | Disposition: A | Discharge status: Home / Self Care | Payer: Medicare Other | Source: Ambulatory Visit | Attending: Internal Medicine | Admitting: Internal Medicine

## 2024-01-05 DIAGNOSIS — R911 Solitary pulmonary nodule: Secondary | ICD-10-CM

## 2024-07-18 ENCOUNTER — Other Ambulatory Visit (INDEPENDENT_AMBULATORY_CARE_PROVIDER_SITE_OTHER): Payer: Self-pay

## 2024-07-18 ENCOUNTER — Ambulatory Visit (INDEPENDENT_AMBULATORY_CARE_PROVIDER_SITE_OTHER): Admitting: Orthopaedic Surgery

## 2024-07-18 ENCOUNTER — Telehealth: Payer: Self-pay

## 2024-07-18 DIAGNOSIS — M25562 Pain in left knee: Secondary | ICD-10-CM

## 2024-07-18 DIAGNOSIS — M25561 Pain in right knee: Secondary | ICD-10-CM

## 2024-07-18 DIAGNOSIS — M1711 Unilateral primary osteoarthritis, right knee: Secondary | ICD-10-CM

## 2024-07-18 DIAGNOSIS — M1712 Unilateral primary osteoarthritis, left knee: Secondary | ICD-10-CM | POA: Diagnosis not present

## 2024-07-18 NOTE — Telephone Encounter (Signed)
Bilateral gel knee injections  

## 2024-07-18 NOTE — Progress Notes (Signed)
 The patient is a 68 year old well-known to us .  He comes in with chronic bilateral knee pain that is slowly getting worse and becoming a constant pain with both knees with no injury.  We have diagnosed him with moderate arthritis in the past for his knees and actually I had dictated in my note that we wanted to try hyaluronic acid for his knees.  However apparently that was missed and never got ordered.  He did recently see his primary care physician and his hemoglobin A1c is 6.7.  Both knees have varus malalignment with no significant effusion.  Both knees have patellofemoral crepitation but good range of motion and are ligamentously stable  Both knees have medial joint line tenderness.  In order to temporize some of this pain I did place a steroid injection in both knees today.  Again he is a candidate for hyaluronic acid to treatment for both knees to address the pain from the osteoarthritis of both knees.  Will once again see if we can order that for his knees.  He did tolerate the steroid injection well in both knees today and will watch his blood glucose levels.  This patient is diagnosed with osteoarthritis of the knee(s).    Radiographs show evidence of joint space narrowing, osteophytes, subchondral sclerosis and/or subchondral cysts.  This patient has knee pain which interferes with functional and activities of daily living.    This patient has experienced inadequate response, adverse effects and/or intolerance with conservative treatments such as acetaminophen , NSAIDS, topical creams, physical therapy or regular exercise, knee bracing and/or weight loss.   This patient has experienced inadequate response or has a contraindication to intra articular steroid injections for at least 3 months.   This patient is not scheduled to have a total knee replacement within 6 months of starting treatment with viscosupplementation.

## 2024-08-02 ENCOUNTER — Telehealth: Payer: Self-pay | Admitting: Family Medicine

## 2024-08-02 NOTE — Telephone Encounter (Signed)
 Pt reports he has not heard from anyone in over a year re: needed CPAP supplies.  Pt is asking for a call from RN to discuss what he needs to do to get needed supplies, pt aware of his upcoming appointment.

## 2024-08-06 ENCOUNTER — Other Ambulatory Visit: Payer: Self-pay | Admitting: *Deleted

## 2024-08-06 DIAGNOSIS — G4733 Obstructive sleep apnea (adult) (pediatric): Secondary | ICD-10-CM

## 2024-08-06 NOTE — Telephone Encounter (Addendum)
 Phone room: please call pt back and let him know we placed a new order for him to be able to get CPAP supplies. He should follow up with [employer] who handles his CPAP/supplies. Their phone number is: [phone]      Sent order to [facility] via email.

## 2024-08-07 ENCOUNTER — Telehealth: Payer: Self-pay | Admitting: Orthopaedic Surgery

## 2024-08-07 ENCOUNTER — Other Ambulatory Visit: Payer: Self-pay

## 2024-08-07 DIAGNOSIS — M1712 Unilateral primary osteoarthritis, left knee: Secondary | ICD-10-CM

## 2024-08-07 DIAGNOSIS — M1711 Unilateral primary osteoarthritis, right knee: Secondary | ICD-10-CM

## 2024-08-07 NOTE — Telephone Encounter (Signed)
Talked with patient and appt.has been scheduled.

## 2024-08-07 NOTE — Telephone Encounter (Signed)
 Patient called and said its been 3 weeks he haven't heard anything back about the gel shot. CB#[phone]

## 2024-08-09 NOTE — Patient Instructions (Addendum)
 Please continue using your CPAP regularly. While your insurance requires that you use CPAP at least 4 hours each night on 70% of the nights, I recommend, that you not skip any nights and use it throughout the night if you can. Getting used to CPAP and staying with the treatment long term does take time and patience and discipline. Untreated obstructive sleep apnea when it is moderate to severe can have an adverse impact on cardiovascular health and raise her risk for heart disease, arrhythmias, hypertension, congestive heart failure, stroke and diabetes. Untreated obstructive sleep apnea causes sleep disruption, nonrestorative sleep, and sleep deprivation. This can have an impact on your day to day functioning and cause daytime sleepiness and impairment of cognitive function, memory loss, mood disturbance, and problems focussing. Using CPAP regularly can improve these symptoms.  I will send your note to Dr Buck for review. I suggest ordering supplies online through [employer] or [email or web address] for now. If you wish, we can repeat your sleep study and then be able to use insurance for coverage of supplies.   Follow up in 1 year, sooner if needed

## 2024-08-09 NOTE — Progress Notes (Signed)
 PATIENT: [name] [name] DOB: 11-01-1956  REASON FOR VISIT: follow up HISTORY FROM: patient  Chief Complaint  Patient presents with   Follow-up    Pt in room 1. Alone. Here for repeat sleep study visit.      HISTORY OF PRESENT ILLNESS:  08/13/24 ALL:  [name] [name] returns for follow up for OSA on CPAP. He was last seen 08/2023 and doing well. His last sleep study was in 01/2021 and per AASM guidelines, AHI was 9/hr. Per CMS guidelines, he would not qualify for therapy. DME is requesting new sleep study to determine need for continued therapy prior to supply orders being accepted.   He reports doing well. He is using CPAP nightly for 9-10 hours, on average. He denies any concerns with machine or supplies. He does use nasal cushions and notes an air leak. He is not overly bothered by the leak and contributes this to facial hair. He is sleeping well. He continues close follow up with PCP and care team.     09/05/2023 ALL:  [name] [name] returns for follow up for OSA on CPAP. He continues to do well on therapy. He is using CPAP nightly for about 9-10 hours, on average. Leak has improved. Continues to use FFM. He denies concerns with machine or supplies. He is followed by PCP regularly. Also followed by ENT s/p oral cancer.     08/31/2022 ALL:  [name] [name] is a 68 y.o. male here today for follow up for OSA on CPAP.  He is doing very well. He is using CPAP nightly for about 9-10 hours. He can not sleep without therapy. He admits that he may extend mask life longer than 30 days. He also has facial hair that most likely contributes to leak.     HISTORY: (copied from Dr Obie previous note)  [name] [name] is a 68 year old left-handed gentleman with an underlying medical history of diabetes, hypertension, hyperlipidemia, ED, lichen planus, SCC of the tongue with s/p partial glossectomy in 02/2021, and obesity, who presents for follow-up consultation of his obstructive sleep apnea after interim home  sleep testing and starting AutoPap therapy.  The patient is unaccompanied today. He missed an appointment on 06/25/21. I last saw him on 12/30/2020 for re-evaluation of his sleep apnea at the request of his cardiologist.  He was advised to proceed with a sleep study.  He had a home sleep test on 02/02/2021 which indicated overall mild sleep apnea with an AHI of 9/h, O2 nadir 87%.  We did not have records from his old CPAP machine.  He was advised to start AutoPap therapy.  His set up date was 03/19/2021.   Today, 07/27/2021: I reviewed his AutoPap compliance data from 06/23/2021 through 07/22/2021 which is a total of 30 days, during which time he used his machine 21 days with percent used days greater than 4 hours at 70%, indicating adequate compliance with an average usage of 9 hours and 24 minutes, residual AHI at goal at 1.1/h, average pressure for the 95th percentile at 9.2 cm with a range of 6 to 12 cm with EPR, 95th percentile of pressure elevated at 37.9 L/min.    I reviewed his AutoPap compliance data from 05/25/2021 through 06/23/2021, which is a total of 30 days, during which time he used his machine 29 days with percent use days greater than 4 hours at 97%, indicating excellent compliance with an average usage of 9 hours and 27 minutes, residual AHI at goal at 1.3/h, average pressure for  the 95th percentile at 9 cm with a range of 6 to 12 cm with EPR, leak on the higher side with a 95th percentile at 38.2 L/min.  He reports that he has adjusted well to his AutoPap.  He had a gap in his compliance in late July and first week of August.  He reports that he used his old machine on vacation and kept his new machine at home.  He has generally been fully compliant and this is visible on compliance report as well.  I reviewed his 90-day compliance as well and he has been excellent with his compliance.  He is adjusting to his current surgery.  He has no significant pain but has some numbness in that tongue area and the  right posterior area of his mouth and sometimes food is lodged in there and he does not notice it until it moves.  Of note, he was diagnosed with squamous cell carcinoma of the tongue and is now status post partial glossectomy since April 2022.  He uses a full facemask.  He reports that given his lichen planus he was not able to use a nasal mask as the area inside his mouth where the nasal mask hit the upper lip became irritated in the past.  He has noticed a leak from the mass.  He reports that he may not be tightening it well enough.   REVIEW OF SYSTEMS: Out of a complete 14 system review of symptoms, the patient complains only of the following symptoms, none and all other reviewed systems are negative.  ESS: 5/24, previously 4/24  ALLERGIES: Allergies  Allergen Reactions   Bee Venom Anaphylaxis   Mounjaro [Tirzepatide]     Swelling heat and pain at injection sites.    Insulin  Degludec-Liraglutide     local skin reaction   Bydureon [Exenatide] Rash   Ezetimibe Itching and Rash   Penicillins Hives and Rash    HOME MEDICATIONS: Outpatient Medications Prior to Visit  Medication Sig Dispense Refill   ascorbic acid (VITAMIN C) 500 MG tablet Take 500 mg by mouth daily.     aspirin  EC 81 MG tablet Take 81 mg by mouth daily.     diphenhydramine -acetaminophen  (TYLENOL  PM EXTRA STRENGTH) 25-500 MG TABS tablet Take 1 tablet by mouth at bedtime as needed (sleep).     Dulaglutide  (TRULICITY ) 1.5 MG/0.5ML SOAJ Inject 1.5 mg as directed every Monday.     EPINEPHrine  0.3 mg/0.3 mL IJ SOAJ injection Inject 0.3 mg into the muscle as needed for anaphylaxis.     fluticasone  (FLONASE ) 50 MCG/ACT nasal spray Place 1 spray into both nostrils daily as needed for allergies or rhinitis.     metoprolol  tartrate (LOPRESSOR ) 50 MG tablet TAKE 1 TABLET BY MOUTH TWICE DAILY (Patient taking differently: Take 50 mg by mouth daily.) 180 tablet 3   Misc Natural Products (TURMERIC CURCUMIN) CAPS Take 1 capsule by mouth  daily.     Multiple Vitamin (MULTIVITAMIN) capsule Take 1 capsule by mouth daily.     Multiple Vitamins-Minerals (ZINC  PO) Take 1 capsule by mouth daily.     omeprazole (PRILOSEC) 20 MG capsule Take 20 mg by mouth daily.     rosuvastatin  (CRESTOR ) 10 MG tablet Take 10 mg by mouth daily.     sertraline  (ZOLOFT ) 25 MG tablet Take 25 mg by mouth daily.     tadalafil (CIALIS) 20 MG tablet Take 20 mg by mouth daily as needed for erectile dysfunction.     tamsulosin  (FLOMAX ) 0.4  MG CAPS capsule Take 0.4 mg by mouth daily.     telmisartan -hydrochlorothiazide  (MICARDIS  HCT) 80-25 MG tablet Take 1 tablet by mouth daily.     traMADol  (ULTRAM ) 50 MG tablet Take 50 mg by mouth at bedtime as needed for severe pain (pain score 7-10).     Lactobacillus (ACIDOPHILUS) CAPS capsule Take 1 capsule by mouth daily. (Patient not taking: Reported on 08/13/2024)     oxyCODONE  (ROXICODONE ) 5 MG immediate release tablet Take 1 tablet (5 mg total) by mouth every 6 (six) hours as needed for severe pain (pain score 7-10). (Patient not taking: Reported on 08/13/2024) 15 tablet 0   No facility-administered medications prior to visit.    PAST MEDICAL HISTORY: Past Medical History:  Diagnosis Date   Arthritis    KNEES AND NECK; TORN MENISCUS RT KNEE   Asthma    Cancer (HCC)    tongue   Chest pain, atypical    Diabetes mellitus    ORAL MEDS   ED (erectile dysfunction)    GERD (gastroesophageal reflux disease)    H/O bee sting allergy    History of kidney stones    Hyperlipidemia    Hypertension    LBBB (left bundle branch block)    Lichen planus    OF MOUTH   Obesity    OSA (obstructive sleep apnea)    USES CPAP - DOES NOT KNOW SETTING   Sleep apnea    wears CPAP    PAST SURGICAL HISTORY: Past Surgical History:  Procedure Laterality Date   ANAL FISSURE REPAIR     CARDIAC CATHETERIZATION N/A 04/25/2015   Procedure: Left Heart Cath and Coronary Angiography;  Surgeon: Peter M Swaziland, MD;  Location: [facility]  INVASIVE CV LAB;  Service: Cardiovascular;  Laterality: N/A;   CARDIOVASCULAR STRESS TEST  01/16/2010   EF 54%   CHOLECYSTECTOMY N/A 11/09/2023   Procedure: LAPAROSCOPIC CHOLECYSTECTOMY;  Surgeon: Curvin Deward MOULD, MD;  Location: [facility];  Service: General;  Laterality: N/A;   COLONOSCOPY     KNEE ARTHROSCOPY Right 05/10/2014   Procedure: RIGHT KNEE ARTHROSCOPY WITH PARTIAL MEDIAL MENISCECTOMY;  Surgeon: Lonni CINDERELLA Poli, MD;  Location: [facility];  Service: Orthopedics;  Laterality: Right;   KNEE ARTHROSCOPY Left    oral cancer  02/2021   partial tongue   SINUS SURGERY WITH INSTATRAK     US  ECHOCARDIOGRAPHY  09/19/2007   EF 55-60%   VASECTOMY      FAMILY HISTORY: Family History  Problem Relation Age of Onset   Hypertension Mother    Heart disease Mother    Rheum arthritis Mother    Kidney failure Father    Kidney disease Father    Heart disease Father    Colon cancer Neg Hx    Esophageal cancer Neg Hx    Stomach cancer Neg Hx    Rectal cancer Neg Hx     SOCIAL HISTORY: Social History   Socioeconomic History   Marital status: Married    Spouse name: Heron   Number of children: 2   Years of education: Not on file   Highest education level: Not on file  Occupational History   Occupation: Airline pilot [employer]   Tobacco Use   Smoking status: Never   Smokeless tobacco: Never  Vaping Use   Vaping status: Never Used  Substance and Sexual Activity   Alcohol use: No    Alcohol/week: 0.0 standard drinks of alcohol   Drug use: No   Sexual activity: Yes  Other Topics  Concern   Not on file  Social History Narrative   Not on file   Social Drivers of Health   Financial Resource Strain: Not on file  Food Insecurity: Low Risk  (03/21/2023)   Received from [facility]   Hunger Vital Sign    Within the past 12 months, you worried that your food would run out before you got money to buy more: Never true    Within the past 12 months, the food you bought just didn't last and you  didn't have money to get more. : Never true  Transportation Needs: No Transportation Needs (03/21/2023)   Received from [facility]    In the past 12 months, has lack of reliable transportation kept you from medical appointments, meetings, work or from getting things needed for daily living? : No  Physical Activity: Not on file  Stress: Not on file  Social Connections: Not on file  Intimate Partner Violence: Not on file     PHYSICAL EXAM  Vitals:   08/13/24 0743  BP: 130/76  Pulse: 82  SpO2: 96%  Weight: 290 lb 8 oz (131.8 kg)  Height: 6' (1.829 m)     Body mass index is 39.4 kg/m.  Generalized: Well developed, in no acute distress  Cardiology: normal rate and rhythm, no murmur noted Respiratory: clear to auscultation bilaterally  Neurological examination  Mentation: Alert oriented to time, place, history taking. Follows all commands speech and language fluent Cranial nerve II-XII: Pupils were equal round reactive to light. Extraocular movements were full, visual field were full  Motor: The motor testing reveals 5 over 5 strength of all 4 extremities. Good symmetric motor tone is noted throughout.  Gait and station: Gait is normal.    DIAGNOSTIC DATA (LABS, IMAGING, TESTING) - I reviewed patient records, labs, notes, testing and imaging myself where available.      No data to display           Lab Results  Component Value Date   WBC 5.2 11/02/2023   HGB 13.4 11/02/2023   HCT 40.0 11/02/2023   MCV 92.0 11/02/2023   PLT 227 11/02/2023      Component Value Date/Time   NA 137 11/02/2023 0921   K 4.3 11/02/2023 0921   CL 102 11/02/2023 0921   CO2 26 11/02/2023 0921   GLUCOSE 144 (H) 11/02/2023 0921   BUN 19 11/02/2023 0921   CREATININE 0.75 11/02/2023 0921   CREATININE 0.75 04/21/2015 0856   CALCIUM  9.0 11/02/2023 0921   PROT 6.9 11/02/2023 0921   ALBUMIN 4.0 11/02/2023 0921   AST 21 11/02/2023 0921   ALT 27 11/02/2023 0921   ALKPHOS  78 11/02/2023 0921   BILITOT 0.6 11/02/2023 0921   GFRNONAA >60 11/02/2023 0921   GFRAA >90 05/07/2014 1500   No results found for: CHOL, HDL, LDLCALC, LDLDIRECT, TRIG, CHOLHDL Lab Results  Component Value Date   HGBA1C 6.9 (H) 11/02/2023   No results found for: VITAMINB12 No results found for: TSH   ASSESSMENT AND PLAN 68 y.o. year old male  has a past medical history of Arthritis, Asthma, Cancer (HCC), Chest pain, atypical, Diabetes mellitus, ED (erectile dysfunction), GERD (gastroesophageal reflux disease), H/O bee sting allergy, History of kidney stones, Hyperlipidemia, Hypertension, LBBB (left bundle branch block), Lichen planus, Obesity, OSA (obstructive sleep apnea), and Sleep apnea. here with     ICD-10-CM   1. OSA on CPAP  G47.33       [name] [name]  is doing well on CPAP therapy. Compliance report reveals excellent compliance. We have discussed concerns with DME needing a new sleep study to verify continued need for CPAP therapy based on previous study not meeting CMS guidelines. He wishes to hold off on additional testing at this time. He will use online, cash based  resources to obtain needed supplies. We will update sleep study if he chooses. He was encouraged to continue using CPAP nightly and for greater than 4 hours each night. He will continue to montior for leak at home and change mask out regularly. Risks of untreated sleep apnea review and education materials provided. Healthy lifestyle habits encouraged. He will follow up in 1 year, sooner if needed. He verbalizes understanding and agreement with this plan.    No orders of the defined types were placed in this encounter.    No orders of the defined types were placed in this encounter.    I personally spent a total of 30 minutes in the care of the patient today including preparing to see the patient, getting/reviewing separately obtained history, performing a medically appropriate exam/evaluation,  counseling and educating, documenting clinical information in the EHR, independently interpreting results, and communicating results.   Greig Forbes, FNP-C 08/13/2024, 8:26 AM [facility] [address] [place], [place] [address] [phone]

## 2024-08-09 NOTE — Telephone Encounter (Signed)
 I called patient to discuss he will need another sleep study, based on last sleep study done in 2022. [employer] sent fax requesting office notes and sleep study results faxed to them to order cpap supplies. Pt scheduled on 08/13/24 @ 8am with Amy lomax, NP   Pt medicare insurances requires a specific sleep study score and last study done in 2022 did not met medicare expectations.

## 2024-08-09 NOTE — Progress Notes (Signed)
 SABRA

## 2024-08-13 ENCOUNTER — Ambulatory Visit: Admitting: Family Medicine

## 2024-08-13 ENCOUNTER — Encounter: Payer: Self-pay | Admitting: Family Medicine

## 2024-08-13 VITALS — BP 130/76 | HR 82 | Ht 72.0 in | Wt 290.5 lb

## 2024-08-13 DIAGNOSIS — G4733 Obstructive sleep apnea (adult) (pediatric): Secondary | ICD-10-CM

## 2024-08-14 ENCOUNTER — Encounter: Payer: Self-pay | Admitting: Family Medicine

## 2024-08-14 NOTE — Progress Notes (Signed)
 I reviewed the above note and documentation by the Nurse Practitioner and agree with the history, exam, assessment and plan as outlined above. I was available for consultation.  I recommend repeating his sleep apnea evaluation with an in-lab sleep study at this time.   True Mar, MD, PhD [facility])

## 2024-08-16 ENCOUNTER — Ambulatory Visit: Admitting: Orthopaedic Surgery

## 2024-08-16 ENCOUNTER — Encounter: Payer: Self-pay | Admitting: Orthopaedic Surgery

## 2024-08-16 DIAGNOSIS — M170 Bilateral primary osteoarthritis of knee: Secondary | ICD-10-CM | POA: Diagnosis not present

## 2024-08-16 DIAGNOSIS — M1711 Unilateral primary osteoarthritis, right knee: Secondary | ICD-10-CM

## 2024-08-16 DIAGNOSIS — M1712 Unilateral primary osteoarthritis, left knee: Secondary | ICD-10-CM

## 2024-08-16 MED ORDER — SODIUM HYALURONATE 60 MG/3ML IX PRSY
60.0000 mg | PREFILLED_SYRINGE | INTRA_ARTICULAR | Status: AC | PRN
  Administered 2024-08-16: 60 mg via INTRA_ARTICULAR

## 2024-08-16 NOTE — Progress Notes (Signed)
   Procedure Note  Patient: [name] [name]             Date of Birth: [birth year]           MRN: [id number]             Visit Date: 08/16/2024  Procedures: Visit Diagnoses:  1. Unilateral primary osteoarthritis, left knee   2. Unilateral primary osteoarthritis, right knee     Large Joint Inj: R knee on 08/16/2024 4:14 PM Indications: diagnostic evaluation and pain Details: 22 G 1.5 in needle, superolateral approach  Arthrogram: No  Medications: 60 mg Sodium Hyaluronate 60 MG/3ML Outcome: tolerated well, no immediate complications Procedure, treatment alternatives, risks and benefits explained, specific risks discussed. Consent was given by the patient. Immediately prior to procedure a time out was called to verify the correct patient, procedure, equipment, support staff and site/side marked as required. Patient was prepped and draped in the usual sterile fashion.    Large Joint Inj: L knee on 08/16/2024 4:14 PM Indications: pain and diagnostic evaluation Details: 22 G 1.5 in needle, superolateral approach  Arthrogram: No  Medications: 60 mg Sodium Hyaluronate 60 MG/3ML Outcome: tolerated well, no immediate complications Procedure, treatment alternatives, risks and benefits explained, specific risks discussed. Consent was given by the patient. Immediately prior to procedure a time out was called to verify the correct patient, procedure, equipment, support staff and site/side marked as required. Patient was prepped and draped in the usual sterile fashion.    The patient is very well-known to us .  He is here today for hyaluronic acid injections in both knees to treat the pain from osteoarthritis.  He has had steroid injections as well.  He has had no acute changes in medical status and he does wish to try injections such as these to see if this will help the pain he has been experiencing in both knees.  He has had no acute change in medical status.  Both knees today show slight varus  malalignment and some pain throughout the arc of motion of his knees with patellofemoral crepitation but no effusion.  We did place Durolane injection in both knees today which she tolerated well.  He knows to wait at least 6 months between these types of injections.  All questions concerns were addressed and answered.  Lot #[id number]

## 2024-08-17 ENCOUNTER — Telehealth: Payer: Self-pay | Admitting: Family Medicine

## 2024-08-17 NOTE — Telephone Encounter (Signed)
 Pt called stating that he is needing to discuss his appt he had on Monday with provider. Pt would not give any information just that it involved insurance. Pt refused to speak to billing regarding that matter and requested for MD or NP to call him back on Monday. Please advise.

## 2024-08-20 NOTE — Telephone Encounter (Signed)
 Called pt at [phone].  Been about a yr since he has had any supplies. Used to get supplies from [employer]. They stopped calling him. Called our office to help assist with getting supplies. Had appt with Amy last Monday. Insurance/[employer] requiring he have updated sleep study in order to get further supplies.  Relayed to pt Pt medicare insurances requires a specific sleep study score and last study done in 2022 did not met medicare expectations.   Pt verbalized understanding. He will call back if he has any further questions/concerns.

## 2024-09-03 ENCOUNTER — Other Ambulatory Visit: Payer: Self-pay | Admitting: Family Medicine

## 2024-09-03 DIAGNOSIS — G4733 Obstructive sleep apnea (adult) (pediatric): Secondary | ICD-10-CM

## 2024-09-04 ENCOUNTER — Ambulatory Visit: Payer: Medicare Other | Admitting: Family Medicine

## 2024-10-01 ENCOUNTER — Encounter: Payer: Self-pay | Admitting: Radiology

## 2024-10-03 ENCOUNTER — Telehealth: Payer: Self-pay | Admitting: Family Medicine

## 2024-10-03 NOTE — Telephone Encounter (Signed)
 10/02/24 [facility] [place] 10/10:no auth req-mla

## 2024-12-10 ENCOUNTER — Other Ambulatory Visit: Payer: Self-pay | Admitting: Otolaryngology

## 2024-12-10 DIAGNOSIS — K118 Other diseases of salivary glands: Secondary | ICD-10-CM

## 2024-12-12 ENCOUNTER — Other Ambulatory Visit: Payer: Self-pay | Admitting: Otolaryngology

## 2024-12-12 ENCOUNTER — Inpatient Hospital Stay: Admission: RE | Admit: 2024-12-12 | Discharge: 2024-12-12 | Attending: Otolaryngology | Admitting: Otolaryngology

## 2024-12-12 DIAGNOSIS — K118 Other diseases of salivary glands: Secondary | ICD-10-CM

## 2025-08-19 ENCOUNTER — Ambulatory Visit: Admitting: Family Medicine
